# Patient Record
Sex: Female | Born: 1945 | Race: White | Hispanic: No | Smoking: Former smoker
Health system: Southern US, Community
[De-identification: ages and names within clinical notes are randomized; demographics above are authoritative.]

## PROBLEM LIST (undated history)

## (undated) DIAGNOSIS — F419 Anxiety disorder, unspecified: Secondary | ICD-10-CM

## (undated) DIAGNOSIS — E785 Hyperlipidemia, unspecified: Secondary | ICD-10-CM

## (undated) DIAGNOSIS — T4145XA Adverse effect of unspecified anesthetic, initial encounter: Secondary | ICD-10-CM

## (undated) DIAGNOSIS — R413 Other amnesia: Secondary | ICD-10-CM

## (undated) DIAGNOSIS — I255 Ischemic cardiomyopathy: Secondary | ICD-10-CM

## (undated) DIAGNOSIS — F329 Major depressive disorder, single episode, unspecified: Secondary | ICD-10-CM

## (undated) DIAGNOSIS — F410 Panic disorder [episodic paroxysmal anxiety] without agoraphobia: Secondary | ICD-10-CM

## (undated) DIAGNOSIS — I10 Essential (primary) hypertension: Secondary | ICD-10-CM

## (undated) DIAGNOSIS — I219 Acute myocardial infarction, unspecified: Secondary | ICD-10-CM

## (undated) DIAGNOSIS — I2510 Atherosclerotic heart disease of native coronary artery without angina pectoris: Secondary | ICD-10-CM

## (undated) DIAGNOSIS — I519 Heart disease, unspecified: Secondary | ICD-10-CM

## (undated) HISTORY — DX: Hyperlipidemia, unspecified: E78.5

## (undated) HISTORY — PX: CORONARY ANGIOPLASTY WITH STENT PLACEMENT: SHX49

## (undated) HISTORY — DX: Heart disease, unspecified: I51.9

## (undated) HISTORY — DX: Essential (primary) hypertension: I10

## (undated) HISTORY — PX: CATARACT EXTRACTION W/ INTRAOCULAR LENS  IMPLANT, BILATERAL: SHX1307

## (undated) HISTORY — DX: Ischemic cardiomyopathy: I25.5

## (undated) HISTORY — DX: Panic disorder (episodic paroxysmal anxiety): F41.0

## (undated) HISTORY — DX: Atherosclerotic heart disease of native coronary artery without angina pectoris: I25.10

## (undated) HISTORY — DX: Other amnesia: R41.3

## (undated) HISTORY — DX: Anxiety disorder, unspecified: F41.9

## (undated) HISTORY — DX: Major depressive disorder, single episode, unspecified: F32.9

---

## 1978-03-17 DIAGNOSIS — T8859XA Other complications of anesthesia, initial encounter: Secondary | ICD-10-CM

## 1978-03-17 HISTORY — DX: Other complications of anesthesia, initial encounter: T88.59XA

## 1978-03-17 HISTORY — PX: TUBAL LIGATION: SHX77

## 1990-03-17 HISTORY — PX: AUGMENTATION MAMMAPLASTY: SUR837

## 2001-10-11 ENCOUNTER — Ambulatory Visit (HOSPITAL_COMMUNITY): Admission: RE | Admit: 2001-10-11 | Discharge: 2001-10-11 | Payer: Self-pay | Admitting: Internal Medicine

## 2005-03-17 DIAGNOSIS — I219 Acute myocardial infarction, unspecified: Secondary | ICD-10-CM

## 2005-03-17 HISTORY — DX: Acute myocardial infarction, unspecified: I21.9

## 2005-05-21 ENCOUNTER — Encounter: Payer: Self-pay | Admitting: Cardiology

## 2005-09-22 ENCOUNTER — Ambulatory Visit: Payer: Self-pay | Admitting: Cardiology

## 2005-09-22 ENCOUNTER — Encounter: Payer: Self-pay | Admitting: Cardiology

## 2005-09-22 ENCOUNTER — Inpatient Hospital Stay (HOSPITAL_COMMUNITY): Admission: AD | Admit: 2005-09-22 | Discharge: 2005-09-27 | Payer: Self-pay | Admitting: Cardiology

## 2005-09-23 ENCOUNTER — Encounter: Payer: Self-pay | Admitting: Cardiology

## 2005-09-24 ENCOUNTER — Encounter: Payer: Self-pay | Admitting: Cardiology

## 2005-09-24 ENCOUNTER — Encounter: Payer: Self-pay | Admitting: Cardiovascular Disease

## 2005-10-10 ENCOUNTER — Ambulatory Visit: Payer: Self-pay | Admitting: Cardiology

## 2005-10-27 ENCOUNTER — Ambulatory Visit: Payer: Self-pay | Admitting: Cardiology

## 2005-11-28 ENCOUNTER — Ambulatory Visit: Payer: Self-pay | Admitting: Cardiology

## 2005-12-05 ENCOUNTER — Ambulatory Visit: Payer: Self-pay | Admitting: Cardiology

## 2005-12-05 ENCOUNTER — Encounter: Payer: Self-pay | Admitting: Cardiology

## 2006-07-13 ENCOUNTER — Encounter: Payer: Self-pay | Admitting: Cardiology

## 2006-07-15 ENCOUNTER — Ambulatory Visit: Payer: Self-pay | Admitting: Physician Assistant

## 2006-07-15 ENCOUNTER — Encounter: Payer: Self-pay | Admitting: Cardiology

## 2006-08-20 ENCOUNTER — Ambulatory Visit: Payer: Self-pay | Admitting: Cardiology

## 2006-08-24 ENCOUNTER — Ambulatory Visit: Payer: Self-pay | Admitting: Physician Assistant

## 2006-10-22 ENCOUNTER — Encounter: Payer: Self-pay | Admitting: Cardiology

## 2006-11-25 ENCOUNTER — Ambulatory Visit: Payer: Self-pay | Admitting: Cardiology

## 2007-05-11 IMAGING — CR DG CHEST 1V PORT
1 series · 1 of 1 positions shown · non-contrast
Comparison: none

CLINICAL DATA: Acute MI, stent placement.
 PORTABLE CHEST - 1 VIEW, 09/24/05 AT 8779 HOURS:

[view not recorded]
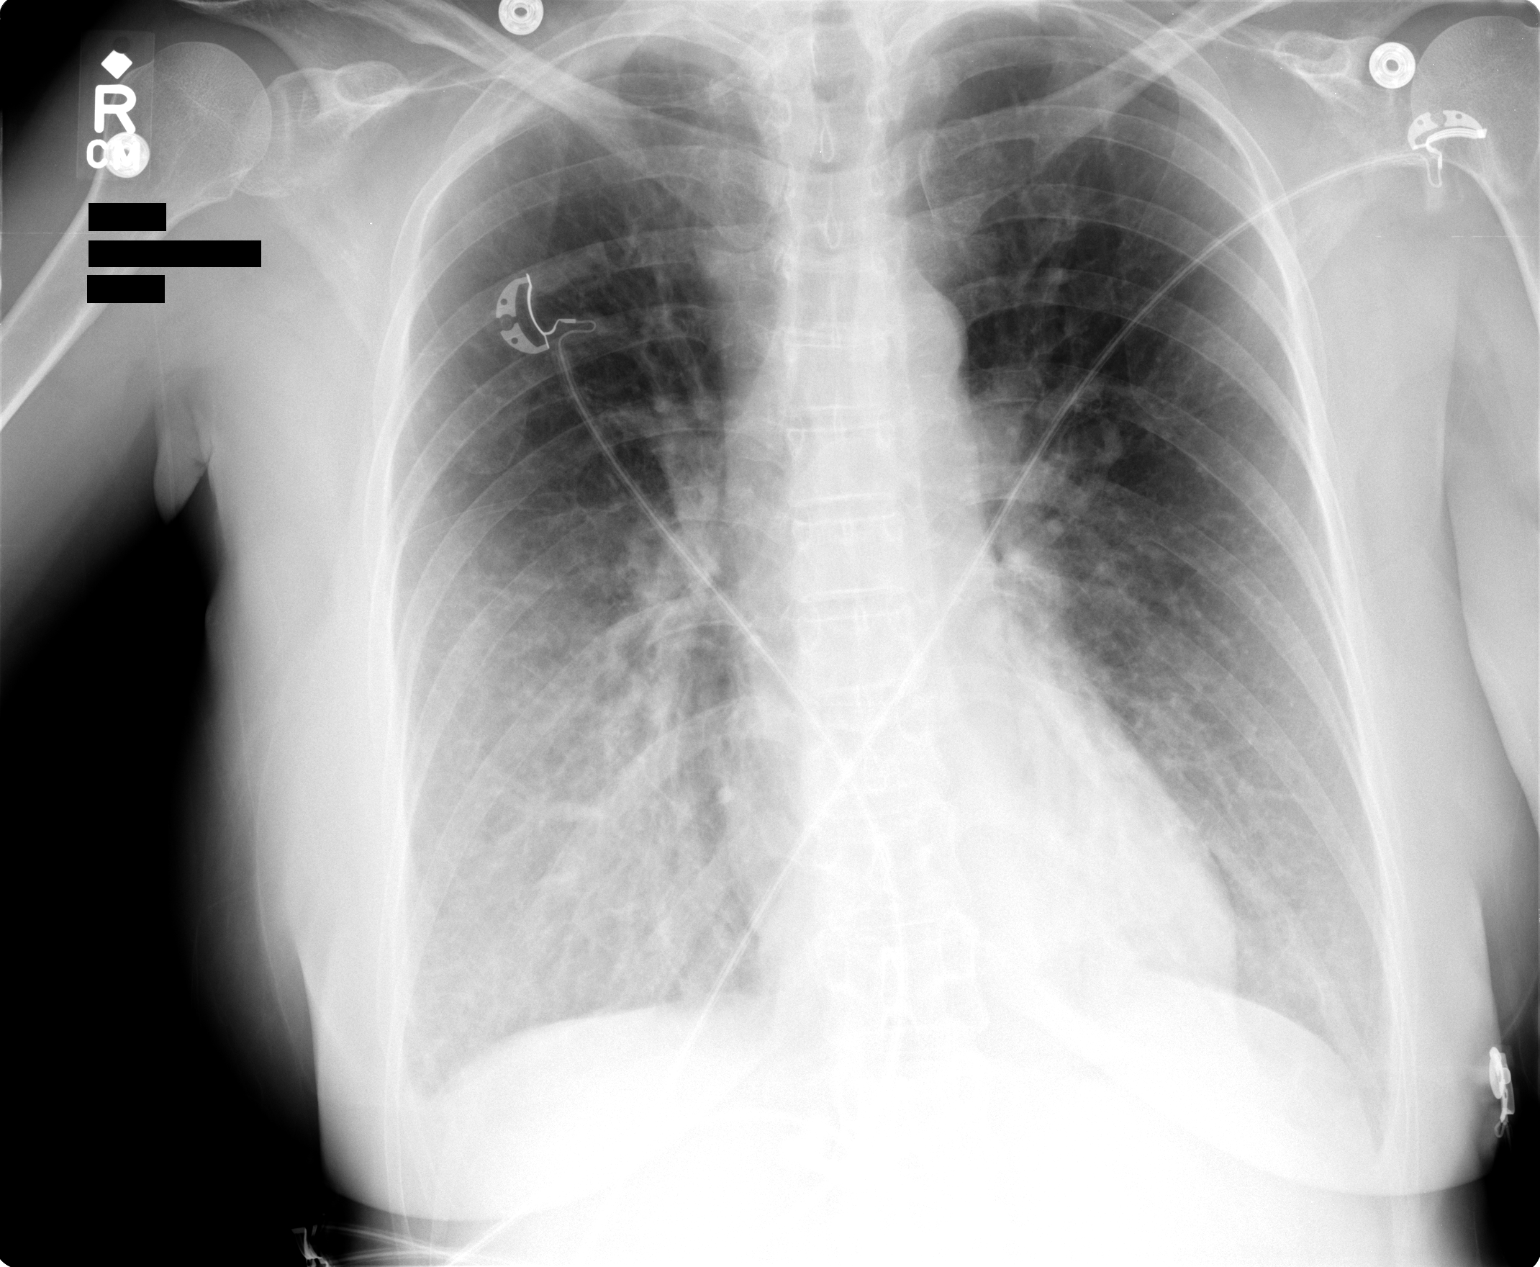

[1 of 1 positions shown; findings below may reference images not displayed]

FINDINGS: Minimal pulmonary vascular congestion is noted.  The lungs are slightly hyperaerated.  There may be very small effusions present.  Heart size is within normal limits.
IMPRESSION: Mild congestion and small effusions.

## 2007-12-16 ENCOUNTER — Ambulatory Visit: Payer: Self-pay | Admitting: Cardiology

## 2008-12-23 DIAGNOSIS — I2510 Atherosclerotic heart disease of native coronary artery without angina pectoris: Secondary | ICD-10-CM | POA: Insufficient documentation

## 2008-12-23 DIAGNOSIS — E785 Hyperlipidemia, unspecified: Secondary | ICD-10-CM | POA: Insufficient documentation

## 2008-12-23 DIAGNOSIS — I2589 Other forms of chronic ischemic heart disease: Secondary | ICD-10-CM

## 2009-02-05 ENCOUNTER — Ambulatory Visit: Payer: Self-pay | Admitting: Cardiology

## 2009-02-05 DIAGNOSIS — F1010 Alcohol abuse, uncomplicated: Secondary | ICD-10-CM | POA: Insufficient documentation

## 2009-02-15 ENCOUNTER — Telehealth (INDEPENDENT_AMBULATORY_CARE_PROVIDER_SITE_OTHER): Payer: Self-pay | Admitting: *Deleted

## 2009-11-06 ENCOUNTER — Telehealth (INDEPENDENT_AMBULATORY_CARE_PROVIDER_SITE_OTHER): Payer: Self-pay | Admitting: *Deleted

## 2010-02-11 ENCOUNTER — Encounter: Payer: Self-pay | Admitting: Cardiology

## 2010-02-19 ENCOUNTER — Encounter: Payer: Self-pay | Admitting: Cardiology

## 2010-03-20 ENCOUNTER — Encounter: Payer: Self-pay | Admitting: Cardiology

## 2010-04-03 ENCOUNTER — Ambulatory Visit
Admission: RE | Admit: 2010-04-03 | Discharge: 2010-04-03 | Payer: Self-pay | Source: Home / Self Care | Attending: Cardiology | Admitting: Cardiology

## 2010-04-03 DIAGNOSIS — F341 Dysthymic disorder: Secondary | ICD-10-CM | POA: Insufficient documentation

## 2010-04-16 NOTE — Progress Notes (Signed)
Summary: surgical clearance  Phone Note Call from Patient Call back at Home Phone [phone] Call back at [phone] ext [phone] work    Caller: Patient needing surgical Reason for Call: Talk to Nurse, Talk to Doctor Summary of Call: leave message onher  office phone if she is not there she needs to have surgical clearance not sure if she needs to be seen or not since last visit was nov 2010.  The procedure that she is having is to replace her breast implants(one had deflated)  Dr. Benna Dunks will be doing the surgery once ok is given. Initial call taken by: Claudette Laws,  November 06, 2009 10:54 AM     Appended Document: surgical clearance Patient can be cleared. Continue ASA if possible.   Appended Document: surgical clearance Pt notified and verbalized understanding.

## 2010-04-16 NOTE — Miscellaneous (Signed)
Summary: EXERCISE CARDIOLITE  Clinical Lists Changes  Orders: Added new Referral order of Nuclear Med (Nuc Med) - Signed

## 2010-04-18 NOTE — Assessment & Plan Note (Signed)
Summary: 1 YR FU -RECV REMINDER VS   Visit Type:  Follow-up Primary Provider:  Sherril Croon   History of Present Illness: the patient is a 65 year old female with a prior history of anterior wall myocardial infarction status post stent placement to the LAD in 2007. The patient underwent recently an exercise Cardiolite stress study. She had good exercise tolerance with no chest pain. Her ejection fraction was 63% there was a mid to distal anterior defect which was nonreversible consistent with prior infarct or scar.  The patient unfortunately is noncompliant with her statin drug therapy which he really does not like to take. She is willing to take a red yeast rice however. She denies any exertional chest pain or shortness of breath orthopnea PND she has no palpitations or syncope. She is doing well from a cardiovascular perspective. She remains physically active and is in cardiac rehabilitation.  She underwent successful repair of a breast implant that was leaking. She had no cardiovascular complications.  Preventive Screening-Counseling & Management  Alcohol-Tobacco     Smoking Status: quit     Year Quit: 2007  Current Medications (verified): 1)  Lisinopril 5 Mg Tabs (Lisinopril) .... Take 1/2 Tablet By Mouth Once A Day 2)  Coreg 3.125 Mg Tabs (Carvedilol) .... Take 1/2 Tablet By Mouth Once A Day 3)  Venlafaxine Hcl 75 Mg Tabs (Venlafaxine Hcl) .... Take 1 Tablet By Mouth Once A Day 4)  Flax Seed Oil 1000 Mg Caps (Flaxseed (Linseed)) .... Take 1 Tablet By Mouth Once A Day 5)  Fish Oil 1000 Mg Caps (Omega-3 Fatty Acids) .... Take 1 Tablet By Mouth Once A Day 6)  Aspirin 325 Mg Tabs (Aspirin) .... Take 1 Tablet By Mouth Once A Day 7)  Red Yeast Rice 600 Mg Tabs (Red Yeast Rice Extract) .... Take 2 Tabs (1200mg ) Two Times A Day  Allergies (verified): No Known Drug Allergies  Comments:  Nurse/Medical Assistant: The patient's medication list and allergies were reviewed with the patient and were  updated in the Medication and Allergy Lists.  Past History:  Past Medical History: Last updated: 12/23/2008 HYPERLIPIDEMIA-MIXED (ICD-272.4) CARDIOMYOPATHY, ISCHEMIC (ICD-414.8) CAD, NATIVE VESSEL (ICD-414.01) Major depressive disorder  Past Surgical History: Last updated: 12/23/2008 breast implants bilateral tubal  ligation.  Family History: Last updated: 12/23/2008 Family History of Coronary Artery Disease:  Family History of Hyperlipidemia:  Family History of Hypertension:  Family History of Thyroid Disease:   Social History: Last updated: 12/23/2008 Full Time Single  Tobacco Use - Yes.  Alcohol Use - no Regular Exercise - no Drug Use - no  Risk Factors: Exercise: no (12/23/2008)  Risk Factors: Smoking Status: quit (04/03/2010)  Review of Systems  The patient denies fatigue, malaise, fever, weight gain/loss, vision loss, decreased hearing, hoarseness, chest pain, palpitations, shortness of breath, prolonged cough, wheezing, sleep apnea, coughing up blood, abdominal pain, blood in stool, nausea, vomiting, diarrhea, heartburn, incontinence, blood in urine, muscle weakness, joint pain, leg swelling, rash, skin lesions, headache, fainting, dizziness, depression, anxiety, enlarged lymph nodes, easy bruising or bleeding, and environmental allergies.    Vital Signs:  Patient profile:   65 year old female Height:      67 inches Weight:      135 pounds BMI:     21.22 Pulse rate:   90 / minute BP sitting:   124 / 75  (left arm) Cuff size:   regular  Vitals Entered By: Carlye Grippe (April 03, 2010 1:20 PM)  Physical Exam  Additional Exam:  General: Well-developed, well-nourished in no distress head: Normocephalic and atraumatic eyes PERRLA/EOMI intact, conjunctiva and lids normal nose: No deformity or lesions mouth normal dentition, normal posterior pharynx neck: Supple, no JVD.  No masses, thyromegaly or abnormal cervical nodes lungs: Normal breath sounds  bilaterally without wheezing.  Normal percussion heart: regular rate and rhythm with normal S1 and S2, no S3 or S4.  PMI is normal.  No pathological murmurs abdomen: Normal bowel sounds, abdomen is soft and nontender without masses, organomegaly or hernias noted.  No hepatosplenomegaly musculoskeletal: Back normal, normal gait muscle strength and tone normal pulsus: Pulse is normal in all 4 extremities Extremities: No peripheral pitting edema neurologic: Alert and oriented x 3 skin: Intact without lesions or rashes cervical nodes: No significant adenopathy psychologic: Normal affect    Impression & Recommendations:  Problem # 1:  CARDIOMYOPATHY, ISCHEMIC (ICD-414.8) the patient has normal LV function. She did have a small scar in the distal anterior wall. She however has no recurrent chest pain. She can continue on medical therapy with risk factor modification. Due to the fact that the patient does not want to take a statin I have recommended 1200 mg p.o. b.i.d. Her updated medication list for this problem includes:    Lisinopril 5 Mg Tabs (Lisinopril) .Marland Kitchen... Take 1/2 tablet by mouth once a day    Coreg 3.125 Mg Tabs (Carvedilol) .Marland Kitchen... Take 1/2 tablet by mouth once a day    Aspirin 325 Mg Tabs (Aspirin) .Marland Kitchen... Take 1 tablet by mouth once a day  Problem # 2:  HYPERLIPIDEMIA-MIXED (ICD-272.4) as outlined above The following medications were removed from the medication list:    Pravachol 40 Mg Tabs (Pravastatin sodium) .Marland Kitchen... Take 1 tab by mouth at bedtime  Problem # 3:  ANXIETY DEPRESSION (ICD-300.4) much improved.  Patient Instructions: 1)  Red yeast rice 1200mg  two times a day  2)  Follow up in  1 year

## 2010-04-18 NOTE — Cardiovascular Report (Signed)
Summary: Cardiac Catheterization  Cardiac Catheterization   Imported By: Dorise Hiss 04/03/2010 11:06:36  _____________________________________________________________________  External Attachment:    Type:   Image     Comment:   External Document

## 2010-07-30 NOTE — Assessment & Plan Note (Signed)
[facility]                          [facility]   NAME:Robin [name], Robin [name]                         MRN:          [id number]  DATE:12/16/2007                            DOB:          Apr 25, 1945    HISTORY OF PRESENT ILLNESS:  The patient is a 65 year old female with a  history of coronary artery disease.  She is status post acute anterior  wall myocardial infarction in June of 2007.  She was stented with a bare-  metal stent to the LAD.  She has mild nonobstructive coronary artery  disease.  She has been doing extremely well.  She has no chest pain,  orthopnea, or PND.  She has no palpitations or syncope.  She states that  she has an extremely good year.   MEDICATIONS INCLUDE:  1. Plavix 75 mg a day.  2. Effexor 75 mg daily.  3. Simvastatin 80 mg daily.  4. Aspirin 81 mg.  5. Carvedilol.  6. Lisinopril.   The patient does report cramping and pain in the upper extremities.  She  is worried about Lyme disease and has been tested, but this was  negative.   PHYSICAL EXAMINATION:  VITAL SIGNS:  Blood pressure 124/76, heart rate  77, weight is 156 pounds.  NECK:  Normal carotid upstroke.  No carotid bruits.  LUNGS:  Clear breath sounds bilaterally.  HEART:  Regular rate and rhythm.  Normal S1 and S2.  No murmurs, rubs,  or gallops.  ABDOMEN:  Soft and nontender.  No rebound or guarding.  Good bowel  sounds.  EXTREMITIES:  No cyanosis.  No clubbing or edema.  NEURO:  The patient is alert and oriented.  Grossly nonfocal.   PROBLEM LIST:  1. Coronary artery disease.      a.     Status post acute anterior ST-elevation myocardial       infarction status post bare-metal stent in 2007.      b.     Moderate residual nonobstructive coronary artery disease.      c.     Cardiolite in September 2007 with normal viability of the       anterior wall, no __________, no ischemia.  2. Ischemic cardiomyopathy.      a.     Ejection fraction 45%.  b.     New York Heart Association class 1.  3. Treated dyslipidemia.  4. Rule out side effects secondary to statin therapy with mild      effects, previous history of alcohol use.  5. Major depressive disorder.   PLAN:  1. The patient is doing quite well from a cardiovascular standpoint.      We do not think of making any changes to her medications, but we do      need to give her refills.  2. I reviewed her EKG and this looks rather benign with only small      septal Q waves.  3. The patient can follow up with me in 1 year.     Learta Codding, MD,FACC  Electronically Signed  GED/MedQ  DD: 12/16/2007  DT: 12/17/2007  Job #: [id number]

## 2010-07-30 NOTE — Assessment & Plan Note (Signed)
[facility]                          [facility] NOTE   Robin [name], Robin [name]                         MRN:          [id number]  DATE:08/24/2006                            DOB:          1946-03-07    CARDIOLOGIST:  Robin Codding, MD,FACC   PRIMARY CARE PHYSICIAN:  Robin [name], M.D.   HISTORY OF PRESENT ILLNESS:  Robin [name] is a 65 year old female patient  with a history of coronary artery disease status post acute anterior ST  elevation myocardial infarction in July of 2007, treated with bare metal  stent to the LAD.  Please see my note from July 15, 2006 for complete  details.  She had a follow up Cardiolite study after her myocardial  infarction.  This revealed a large defect involving the anterior wall  and apex associated with akinetic and dyskinetic wall motion, most  consistent with a scar.  There was not any large viability, nor was then  any large degree of ischemia.  Her EF was 45%.  After I saw her, we set  her up for a follow up echocardiogram.  This showed mild systolic  dysfunction with an EF of 45%, distal posterior, periapical, distal  septal akinesis consistent with prior infarction and/or scar.  There  were no significant valvular abnormalities.  She returns to the office  today for a follow up.  When I last saw her, we tried to switch her over  to Coreg, but she had just gotten metoprolol filled, and she has not  switched over to that just yet.  She notes that she is doing well.  She  denies any chest discomfort reminiscent for previous angina.  She denies  any significant shortness of breath.  Denies any syncope or near  syncope.  She does note occasional chest burning that is clearly  associated with meals.  This happens quite rarely.  The last time she  noticed it was about 2-3 weeks ago.  She has been working on her diet  and has not yet gotten back into exercise.   CURRENT MEDICATIONS:  1. Plavix 75 mg daily.  2.  Metoprolol ER 25 mg a day.  3. Effexor 75 mg daily.  4. Simvastatin 80 mg q.h.s.  5. Aspirin 80 mg a day.  6. Nitroglycerin p.r.n. chest pain.  7. Xanax p.r.n. chest pain.   ALLERGIES:  No known drug allergies.   PHYSICAL EXAMINATION:  GENERAL:  She is a well-nourished, well-developed  female in no acute distress.  VITAL SIGNS:  Blood pressure 124/79, pulse 82, weight 155 pounds.  HEENT:  Normal.  NECK:  Without JVD.  HEART:  S1 and S2.  Regular rate and rhythm without murmurs.  LUNGS:  Clear to auscultation bilaterally without wheezes, rhonchi or  rales.  ABDOMEN:  Nontender with normoactive bowel sounds.  No organomegaly.  EXTREMITIES:  Without edema.  Calves soft and nontender.  SKIN:  Warm and dry.  NEUROLOGIC:  She is alert and oriented x3.  Cranial nerves II-XII are  intact.   IMPRESSION:  1. Coronary artery disease.  a.     Status post acute anterior ST elevated myocardial infarction       treated with a bare metal stent to the left anterior descending in       July of 2007.      b.     Moderate residual non-obstructive coronary artery disease as       outlined in previous notes.      c.     Cardiolite in September of 2007 revealing non-viability in       the anterior wall and apex with no large degree of ischemia.  2. Ischemic cardiomyopathy with an ejection fraction of 45%.      a.     New York Heart Association Class II symptoms.  3. Treated dyslipidemia.  4. Exsmoker.  5. Previous history of alcohol abuse.  6. Major depressive disorder, treated.   PLAN:  The patient returns to the office today for follow up.  Overall,  she is doing well.  We did recently recheck her lipid panel, and this  was improved.  Her triglycerides were 167, cholesterol 179, HDL 43, LDL  103.  She notes that she has learned that Effexor can have an effect on  your cholesterol.  I did look this up, and sure enough it can cause  hypercholesterolemia.  I have asked her that she go back to  see Dr. Sherril [name]  to see if there are any other alternative antidepressants that she could  try in lieu of Effexor.  Her LDL numbers have come down considerably  since we last saw each other.  I think we will give her another 3 months  on high-dose simvastatin.  We will recheck her lipids and LFT's in about  3 months.  If her LDL is still above 70, then we will change her to  either Crestor or Vytorin at that time.   She will begin the Coreg 3.125 mg twice a day soon after she runs out of  the metoprolol.   I will also start her on an ACE inhibitor with lisinopril 2.5 mg a day.  We will have her follow up in 1 weeks' time for a BMET.   I will bring her back in 3 months' time to see Dr. Andee [name] in follow up.   Addendum:  I will actually have her repeat lipids in 1 month and make  appropriate changes at that time, if needed.      Robin Newcomer, PA-C  Electronically Signed      Robin Abed, MD, [facility]  Electronically Signed   SW/MedQ  DD: 08/24/2006  DT: 08/24/2006  Job #: [id number]   cc:   Robin [name]

## 2010-07-30 NOTE — Assessment & Plan Note (Signed)
[facility]                          [facility]   NAME:Robin [name], Robin [name]                         MRN:          [id number]  DATE:11/25/2006                            DOB:          1945-08-20    HISTORY OF PRESENT ILLNESS:  Patient is a 65 year old female with a  history of coronary artery disease, status post acute anterior wall  myocardial infarction June of 2007. The patient was treated with a bare  metal stent to the LAD. She has done quite well. She has no recurrent  sub sternal chest pain or shortness of breath, orthopnea, or PND. She  remains very active. She is complied with her medical regimen although  she does report to me that she did not take her Coreg in the morning.  She feels very fatigued after taking a dose of Coreg and cut it back to  once a day. She denies any palpitations or syncope. She has stopped  smoking.   MEDICATIONS:  1. Plavix 75 mg p.o. daily.  2. Effexor 75 mg p.o. daily.  3. Simvastatin 80 mg p.o. at bedtime.  4. Aspirin 81 mg a day.  5. Carvedilol 3.125 mg p.o. b.i.d. but the patient is only taking it      once a day.  6. Lisinopril 2.5 mg p.o. daily.   PHYSICAL EXAMINATION:  VITAL SIGNS: Blood pressure is 117/73, heart rate  is 77, weight is 153 pounds.  NECK EXAM: Normal carotid upstroke. No carotid bruits.  HEENT: Normal.  LUNGS: Clear breath sounds bilaterally.  HEART: Regular rate and rhythm. Normal S1, S2. No murmurs, rubs, or  gallops.  ABDOMEN: Soft, nontender. No rebound or guarding. Good bowel sounds.  EXTREMITY EXAM: No cyanosis, clubbing, or edema.  NEURO: Patient is alert and oriented and grossly nonfocal.   PROBLEM LIST:  1. Coronary artery disease.      a.     Status post acute anterior ST elevation myocardial       infarction treated with a bare metal stent July 2007.      b.     Moderate residual nonobstructive coronary disease.      c.     Cardiolite September 2007 with  nonviability of anterior wall       and apex but no ischemia.  2. Ischemic cardiomyopathy.      a.     Ejection fraction of 45%.      b.     NYHA class of 1 to 2.  3. Treated dyslipidemia LDL now 81.  4. Ex-smoker.  5. Previous history of alcohol use.  6. Major depressive disorder, treated.   PLAN:  1. The patient is doing quite well. She reports no recurrent sub      sternal chest pain. We can continue her current medical regimen.  2. I did increase patient's lisinopril to 5 mg p.o. daily given her      left ventricular dysfunction.  3. We also reviewed the patient's lipid panel and her triglycerides      were elevated, and her LDL was at  goal. We increased her fish oil      to 1 gram p.o. b.i.d.  4. The patient can follow up with [place] in 1 year.     Learta Codding, MD,FACC  Electronically Signed    GED/MedQ  DD: 11/25/2006  DT: 11/26/2006  Job #: [id number]   cc:   Doreen Beam

## 2010-08-02 NOTE — H&P (Signed)
NAME:  Robin [name], Robin [name] NO.:  [id number]   MEDICAL RECORD NO.:  [id number]          PATIENT TYPE:  OIB   LOCATION:  2807                         FACILITY:  [facility]   PHYSICIAN:  Robin [name], M.D. [facility] OF BIRTH:  [birth year]   DATE OF ADMISSION:  09/22/2005  DATE OF DISCHARGE:                                HISTORY & PHYSICAL   BRIEF HISTORY:  Robin [name] is a 65 year old white female who is transferred  via EMS from [facility] secondary to acute myocardial infarction.  The patient describes left shoulder blade aching sensation and some chills  since yesterday afternoon.  She initially attributed this to musculoskeletal  discomfort secondary to heavy yard work with her upper extremities.  However  this morning her discomfort was still there, it became worse as the day  progressed and she continued to feel bad.  Approximately at 1:30 she went to  a neighbor's house who called EMS.  Upon EMS arrival she developed anterior  chest discomfort and EKG was consistent with myocardial infarction.  On  arrival to [facility] she received IV heparin, sublingual  nitroglycerin, aspirin, and IV nitroglycerin prior to transportation.   PAST MEDICAL HISTORY:  Notable for allergy to yellow jackets.  No known drug  allergies.   MEDICATIONS:  Prior to admission included Effexor unknown dosage daily.   PAST MEDICAL HISTORY:  Notable for recent diagnosis of mild emphysema.  She  denies any problems and diabetes, hypertension, myocardial infarction, CVA,  bleeding dyscrasias, thyroid dysfunction, or hyperlipidemia.   PAST SURGICAL HISTORY:  Notable for breast implants and bilateral tubal  ligation.   SOCIAL HISTORY:  She resides in [place] alone.  She has two sons that are alive  and well, two grandchildren, no great-grandchildren.  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  She smokes approximately one pack per day for 25 years,  has not had any alcohol in nine years.   Denies any drugs, herbal  medications, or specific diet.  She does not exercise, per se, but she is  very active, particularly in the yard.   FAMILY HISTORY:  Her mother died at age of 57, question neurological  problems, history of hypertension.  Father died at the age of 65 with a  history of bypass surgery is 15, peripheral vascular disease/amputation,  hyperlipidemia.  One brother died suddenly at the age of 89.  She has two  sisters, age 87 who is alive and age 8 1 with a history of hypertension,  thyroid disorder, hyperlipidemia, and tobacco use.   REVIEW OF SYSTEMS:  Notable for glasses, recent bronchitis approximately one  month ago, recent fatigue, postmenopausal treatment for depression.   PHYSICAL EXAMINATION:  GENERAL:  Well-nourished, well-developed, pleasant  white female in no apparent distress.  HEENT:  Initial blood pressure is 161/70, pulse 88 and regular, respirations  20, 98% sat on 2 liters.  HEENT:  Essentially unremarkable.  NECK:  Supple without thyromegaly, adenopathy, JVD or carotid bruits.  CHEST:  Symmetrical excursion and decreased breath sounds, but no rales,  rhonchi or wheezing.  HEART:  The heart sounds are distant.  Regular rate  and rhythm.  Normal S1, S2.  I do not appreciate any murmurs, rubs, clicks  or gallops.  SKIN/INTEGUMENT:  Intact.  ABDOMEN:  Slightly rounded.  Bowel sounds present without organomegaly,  masses or tenderness.  EXTREMITIES:  No cyanosis, clubbing, or edema.  Peripheral pulses are  symmetrical and intact.  The right groin has catheterization sheaths in  place.  I did not appreciate any left groin bruits.  MUSCULOSKELETAL:  Unremarkable.  NEUROLOGIC:  Unremarkable.   Chest x-ray report from [facility] is pending.  EKG from [facility] showed some sinus rhythm with second degree AV block, ST-segment  elevation inferolaterally, normal intervals.  H&H is 14.2 and 42.1, normal  indices, platelets 295,000, WBCs  10.5, sodium 140, potassium 4.0, BUN 6,  creatinine 0.7, glucose 165.  Initial CK was 75 with MB and troponins are  pending from [facility].  PTT is 23 and PT 10.6.   IMPRESSION:  1.  Acute anterolateral myocardial infarction.  2 . Tobacco use/emphysema.  1.  Hypertension.   DISPOSITION:  Dr. Riley [name], after reviewing the procedure, risks, and  benefits, proceeded with cardiac catheterization with Robin [name].  She will  be admitted to the coronary care unit and further treatment will be pending  her cardiac catheterization.  We will continue aspirin and place her on a  Statin medication and follow-up on blood work.      Robin [name], P.A. [facility]      Robin [name], M.D. [facility]  Electronically Signed    EW/MEDQ  D:  09/22/2005  T:  09/22/2005  Job:  [id number]   cc:   Robin [name]  Fax: [phone]   Robin [name], M.D. [facility]  [address]  [address]  [place]  [place] [address]

## 2010-08-02 NOTE — Assessment & Plan Note (Signed)
[facility]                            [facility]   NAME:Robin [name], Robin [name]                         MRN:          [id number]  DATE:10/10/2005                            DOB:          1945-09-19    The patient had an acute anterior MI and was urgently treated with bare-  metal stent on September 22, 2005.  The procedure was done by Dr. Riley Kill.  The  LAD was occluded.  There was other mild coronary disease to be followed.  It  was suggested that a followup echocardiogram be done at 6 weeks.  The  ejection fraction in the catheterization lab was in the 40% range but early  echocardiogram in the hospital (?) was in the 50% range.  The patient had  hypotension that led to captopril being stopped while in the hospital.  It  was therefore recommended that an ACE inhibitor not be started at that point  and the patient was tolerating low-dose Lopressor.  She was counseled in the  hospital on smoking cessation and, in fact, it appears she has stopped.   The patient is here with her sister today.  She did have a CPK of 2900 with  an MB of 260 and a peak troponin of 98.  She did have some liver function  elevations that may have been due to her MI.  Repeat LFTs are necessary.  She is on Lipitor.  The patient is not having any significant chest  discomfort.  Her original ischemic symptom was pain across her back and  shoulders.  She has had no return of this.  She has no marked shortness of  breath.  She feels fatigued even on low-dose metoprolol.   PAST MEDICAL HISTORY:  Allergies:  No known drug allergies.   Medications:  Plavix 75, metoprolol 25 (to be reduced to 12.5), Effexor 75,  Lipitor 40, and aspirin 325.   Other medical problems:  See the complete list below.   REVIEW OF SYSTEMS:  She has had some fatigue.  She is increasing her  activities.  She has a slight burning sensation in her left chest which does  not appear to be ischemia.   Otherwise, her review of systems is negative.   PHYSICAL EXAMINATION:  VITAL SIGNS:  Blood pressure is 110/60 with a pulse  of 71.  GENERAL:  The patient is oriented to person, time and place and her affect  is normal.  LUNGS:  Clear.  Respiratory effort is not labored.  HEENT:  Reveals no xanthelasma.  She has normal extraocular motion.  There  are no carotid bruits.  There is no jugular venous distention.  CARDIAC:  Reveals an S1 with an S2.  There are no clicks or significant  murmurs.  ABDOMEN:  Soft.  There is no peripheral edema.  There are no musculoskeletal  deformities.   EKG reveals sinus rhythm.  The patient has decreased voltage.  She has  anterior Q waves across the precordium.   PROBLEMS:  1.  Acute anterior myocardial infarction treated with bare-metal stenting to  the left anterior descending coronary artery on September 22, 2005.  2.  Ejection fraction 40% in the catheterization laboratory, question 50% by      early followup echocardiogram.  It was recommended that another      echocardiogram be done at 6 weeks and this is being scheduled.  3.  Residual noncritical circumflex disease and first diagonal disease and      right coronary artery disease.  4.  Hypotension, resolved, although she has relative hypotension.  5.  Dyslipidemia.  6.  Elevated liver enzymes and these will be repeated.  7.  I have considered adding an ACE inhibitor today.  However, because of      her marked fatigue I have not started it today and this should be      reconsidered when she is seen back for her next followup visit.  8.  Smoking.  It appears that she has stopped.   I have given permission to the patient to resume driving.  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  She knows not to do  any heavy lifting.  I have recommended cardiac rehab and this is to be  looked into.  When she is seen back, strong consideration needs to be given  to trying her on an ACE  inhibitor.                                   Luis Abed, MD, [facility]   JDK/MedQ  DD:  10/10/2005  DT:  10/10/2005  Job #:  [id number]

## 2010-08-02 NOTE — Cardiovascular Report (Signed)
Robin [name], Robin [name] NO.:  [id number]   MEDICAL RECORD NO.:  [id number]          PATIENT TYPE:  OIB   LOCATION:  2908                         FACILITY:  [facility]   PHYSICIAN:  Arturo Morton. Riley Kill, M.D. [facility] OF BIRTH:  [birth year]   DATE OF PROCEDURE:  DATE OF DISCHARGE:                              CARDIAC CATHETERIZATION   INDICATIONS:  Ms. Grigoryan is a 65 year old who presents with an acute anterior  wall infarction.  She was seen in the emergency room at [facility],  and transferred for immediate reperfusion therapy.  She arrived in the  catheterization laboratory and consented to enrollment in the [facility]  trial.   PROCEDURES:  1.  Left heart catheterization.  2.  Selective coronary arteriography.  3.  Selective left ventriculography.  4.  PTCA and stenting of the left anterior descending artery.   DESCRIPTION OF PROCEDURE:  The patient was brought to the catheterization  laboratory, and prepped and draped in the usual fashion.  She had a full  bladder.  There was an initial attempt to pass a Foley catheter, but this  was somewhat difficult.  There was some mild time delay in getting the case  started related to this.  We elected to abandon this, and the procedure was  started.  Through an anterior puncture, the right femoral artery was easily  entered.  A 6-French sheath was placed.  ACT was checked.  Views of the  right coronary artery were then obtained.  Following this, a JL-3.5 guiding  catheter was used intubate the left coronary.  The patient was randomized in  the [facility] trial, and given bivalirudin according to protocol.  ACT was  checked and found to be appropriate for percutaneous intervention.  A  Prowater guidewire was then placed down the artery and initial dilatation  done with a 2 mm x 15 Maverick balloon.  There was successful reperfusion  with this.  We then redirected the wire into the left anterior descending  artery, and  inflations were done with both a 2.5 and a 2.75-mm balloon.  There was marked improvement in the appearance of the artery.  We then  debated whether or not to place a stent, as the patient has multivessel  disease and we were considering potentially other options.  The proximal  lesion that was dilated showed some deterioration, and we went ahead and  placed a 32 x 2.75 Liberte stent just distal to the takeoff of the first  diagonal and overlapping the second diagonal, and ending it just prior to an  area of disease in the midvessel.  There was dramatic improvement in the  appearance of the artery.  At beginning of procedure, there was a 90%  stenosis and the total occlusion and, following this, this was all reduced  to 0% with establishment of TIMI III flow.  Then, following this,  ventriculography was then performed in the RAO projection.  All catheters  were subsequently removed and the femoral sheath was sewn into place.  I  reviewed the patient's angiograms with the patient's sister in detail.  Because of her markedly elevated LVEDP, we just gave her one dose of  intravenous metoprolol during the course of the procedure, and she was  moderately hypotensive after initial reperfusion.  There were no major  complications, and she was taken to the holding area in satisfactory  clinical condition.   HEMODYNAMIC DATA:  Initial central aortic pressure 119/83 with a mean of  101.  The left ventricular pressure was 94/31 and there was no gradient on  pullback across aortic valve.   ANGIOGRAPHIC DATA:  1.  Ventriculography was done in the RAO projection at the completion of      procedure.  There was hypokinesis to akinesis of the anterolateral      apical and distal inferior segments with an ejection fraction that would      be estimated in the range of about 40%.  There was some ectopy making      calculation difficult.  There was about 2+ mitral regurgitation noted.  2.  Right coronary is  a large-caliber vessel.  There was about 30-40% area      of mid disease and some luminal irregularity distally.  There is mild      collateralization of the mid-LAD noted on the initial right injection.  3.  The left main is free of critical disease.  4.  The left anterior descending artery demonstrates a 90% stenosis after      the takeoff of the first diagonal.  Following this, the vessel was      totally occluded.  Following reperfusion therapy this whole area is      reduced to 0%, over a 32 mm area.  Just distal to the stent, there is a      40-50% area of focal narrowing in the LAD, after the third diagonal      branch.  We specifically did not stent this, largely because of the      concern about potentially crossing another diagonal and then the lesion      did not appear that tight.  The first diagonal has about 70% ostial      narrowing and 70% mid-narrowing.  The second diagonal was a moderate      sized vessel that had about 90% narrowing and had a fairly steep angle      takeoff from the stent itself, and did not close down with coverage with      the stent in this area.  5.  The circumflex provides a first marginal branch with about 70% ostial      and then 70% mid-narrowing.  The AV circumflex has about 40% plaquing      and about 60% in the large marginal branch.   CONCLUSIONS:  1.  Moderate reduction in left ventricular function with elevated left      ventricular end-diastolic pressure in the distribution of the mid and      distal left anterior descending artery.  2.  Successful reperfusion therapy using a non-drug-eluting stent platform.  3.  Multivessel coronary artery disease with small to moderate size branch      involvement.   DISPOSITION:  I plan to review the films with my colleagues.  At the present  time, we will likely recommend aggressive medical therapy.  The major LAD is open and the right is not critically diseased.  The AV circumflex is also  not  critically disease, as well as the circumflex does not appear to be  critical.  There is significant diagonal disease, as well as first marginal  branch disease, and a fairly small-caliber vessel.  However, based on this,  we will likely recommend medical therapy, but I will review with my  colleagues.      Arturo Morton. Riley Kill, M.D. [facility]  Electronically Signed     TDS/MEDQ  D:  09/22/2005  T:  09/23/2005  Job:  [id number]   cc:   Doreen Beam  Fax: [phone]   Jonelle Sidle, M.D. [facility]  [address]  [place]  [place] [address]   [facility] Laboratory

## 2010-08-02 NOTE — Assessment & Plan Note (Signed)
[facility]                            [facility] NOTE   NAME:[name], [name]                         MRN:          [id number]  DATE:12/02/2005                            DOB:          Mar 21, 1945    HISTORY OF PRESENT ILLNESS:  The patient is a 65 year old female status post  acute anterior wall myocardial infarction on September 22, 2005.  The patient  underwent bare metal stent placement.  The patient had additional moderate  coronary artery disease.  The patient's initial ejection fraction was 40% at  range but at time of discharge reportedly her ejection fraction was 50%.  A  followup echocardiogram however, done at [facility] on November 13, 2005  demonstrate an ejection fraction of 30 to 35%.  In retrospect the patient  appeared  to have a  large anterior wall myocardial infarction with probable  late reprofusion.  The patient now presents for a followup.  The patient  reports occasional sensation of burning in the chest.  She denies any chest  pain.  She feels weak and fatigued.  She does get short of breath on  moderate exertion.  She stopped smoking tobacco.  She has occasional  tinnitus.   MEDICATIONS:  1. Plavix 75 mg daily.  2. Effexor 75 mg daily.  3. Lipitor 80 mg, half tablet p.o. daily.  4. Enteric coated aspirin 1/2 a tablet p.o. daily.   Of  note that the patient was not able to be started on ACE inhibitor due to  a relatively low blood pressure.   PHYSICAL EXAMINATION:  VITAL SIGNS:  Blood pressure 100/62.  Heart rate 78  beats per minute.  Weight is 145 pounds.  NECK:  Normal carotid upstroke and no carotid bruits.  LUNGS:  Breath sounds bilaterally.  HEART:  Regular rate and rhythm with normal S1, S2.  ABDOMEN:  Soft.  EXTREMITIES:  No cyanosis, clubbing or edema.   PROBLEM:  1. Status post anterior wall myocardial infarction.      a.     Bare metal stented LAD.      b.     Late reperfusion.      c.     Left  ventricular dysfunction ejection fraction 30 to 35% one       month after percutaneous coronary intervention.  2. Weakness.      a.     NYHA class 2B.  3. History of tobacco use.  4. Relative hypotension.  5. Dyslipidemia.  6. History of elevated liver function tests.   PLAN:  1. Patient will be scheduled for adenosine Cardiolite study with low level      exercise next week.  The patient report had 70% circumflex lesion and      70% diagonal lesion.  I want to make sure that she does not have any      residual ischemiaas well as to assess the viability of her anterior      wall.  2. The patient has been enrolled in cardiac rehab as of this office visit.  3. The patient  cannot be started on ACE inhibitor due to her relatively      low blood pressure and we will continue to monitor this.  4. I started the patient on Aldactone given her primary myocardial      infarction with low ejection fraction.  5. We will follow the patient closely and she may need to be considered in      the near future for implantable cardioverter-defibrillator  and      possible cardiac resynchronization therapy.                                   Learta Codding, MD,FACC   GED/MedQ  DD:  12/02/2005  DT:  12/03/2005  Job #:  [id number]

## 2010-08-02 NOTE — Op Note (Signed)
NAME:  Robin [name], BAUMBACH                            ACCOUNT NO.:  [id number]   MEDICAL RECORD NO.:  [id number]                   PATIENT TYPE:  AMB   LOCATION:  DAY                                  FACILITY:  [facility]   PHYSICIAN:  Gerrit Friends. Rourk, M.D.               DATE OF BIRTH:  1945-12-03   DATE OF PROCEDURE:  10/11/2001  DATE OF DISCHARGE:  10/11/2001                                 OPERATIVE REPORT   PROCEDURE:  Diagnostic colonoscopy.   INDICATIONS FOR PROCEDURE:  The patient is a 66 year old lady with a recent  illness characterized by abdominal bloating, cramps, diarrhea, and fever.  Symptoms lasted for approximately one week earlier this month, and those  symptoms have resolved.  CT of the abdomen and pelvis at [facility]  on 09/28/01 demonstrated possibly a filling defect in the cecum and early  changes suggestive of sigmoid diverticulosis.  Colonoscopy is being done to  further evaluate the abnormalities seen on CT scan.  This approach has been  discussed with [name] at the bedside.  Potential risks, benefits, and  alternatives have been reviewed, questions answered, and she is agreeable.   DESCRIPTION OF PROCEDURE:  Oxygen saturation, blood pressure, pulse, and  respirations were monitored throughout the entire procedure.   Conscious sedation:  Versed 3 mg IV, Demerol 75 mg IV, in divided doses.   Instrument:  Olympus video chip colonoscope.   Findings:  Digital rectal examination revealed no abnormalities.   Colonoscopic findings:  The prep was good.  Rectum:  Examination of the  rectal mucosa, including retroflexed view of the anal verge, revealed only  internal hemorrhoids.  Colon:  The colonic mucosa was surveyed from the  rectosigmoid junction through the left, transverse, right colon to the area  of the appendiceal orifice and the ileocecal valve and cecum.  These  structures were well-seen and photographed for the record.  They appeared  normal.  The  colonic mucosa all the way to the cecum appeared normal.  The  terminal ileum was intubated to 10 cm, and this segment of the GI tract also  appeared normal.  From this level the scope was slowly withdrawn, and all  previously-viewed mucosal surfaces were again seen, and again no  abnormalities were observed.  The patient tolerated the procedure well and  was reactivated in endoscopy.   IMPRESSION:  1. Internal hemorrhoids, otherwise normal rectum.  2. Normal colon.  3. Normal terminal ileum.   I suspect the abnormality on CT scan was stool.  [name] most likely had a  self-limiting infectious process to account for her recent symptoms.  I am  glad to see that she is now asymptomatic.    RECOMMENDATIONS:  1. Repeat colonoscopy in 10 years.  2. Follow up with Dr. Sherril Croon as needed.  Gerrit Friends. Rourk, M.D.    RMR/MEDQ  D:  10/11/2001  T:  10/15/2001  Job:  [id number]   cc:   Doreen Beam

## 2010-08-02 NOTE — Assessment & Plan Note (Signed)
[facility]                          [facility] NOTE   NAME:Robin [name], Robin [name]                           MRN:          [id number]  DATE:07/15/2006                            DOB:          Nov 10, 1945    PRIMARY CARE PHYSICIAN:  Dr. Sherril Croon.   PRIMARY CARDIOLOGIST:  Dr. Lewayne Bunting.   HISTORY OF PRESENT ILLNESS:  Ms. Cowie is a 65 year old patient with a  history of coronary disease, status post acute anterior ST elevation  myocardial infarction in July 2007 treated with a bare metal stent to  the LAD who had residual stenosis of 70% ostial and 70% mid in the first  diagonal, 90% stenosis in the second diagonal, 70% ostial and 70% mid in  the first marginal branch of the circumflex, AV circumflex with 40%  plaque and about 60% large marginal branch, and 40% - 50% narrowing in  the LAD beyond the third diagonal.  The RCA had mild disease with 30% -  40% mid lesions and minimal irregularities distally.  At the time of the  patient's myocardial infarction her EF was about 40% - 50%.  A followup  echocardiogram revealed an EF of 30% - 35%.  She saw Dr. Andee Lineman in  September of 2007.  At that time he set her up for an adenosine Myoview  to assess her for viability and reassess her LV function.  That was  performed September of 2007.  It revealed a large, dense defect  involving the anterior wall and the apex associated with akinetic to  dyskinetic wall motion, most consistent with scar.  There did not appear  to be any large viability in this segment or any large degree of  ischemia, and the EF was calculated at 45%.  She returns today for  followup.  She tells me that after her stress test was performed she was  taken off of her aldactone by Dr. Andee Lineman.  She is doing well.  She  continues to be somewhat fatigued.  She did finish cardiac rehab.  She  denies any significant dyspnea on exertion.  She denies any chest  discomfort reminiscent of her previous  angina.  She does have occasional  burning in her left chest associated with belching.  This occurs about  every 2 or 3 weeks.  She denies any waterbrash symptoms, dysphagia,  odynophagia.  She denies any associated shortness of breath, nausea,  diaphoresis.  She denies any syncope or near syncope.  She does have  palpitations.  These have been ongoing for many years.  She says has  been diagnosed with PVCs.  She had these even before her myocardial  infarction and they have not changed any since then.  She denies  orthopnea, paroxysmal nocturnal dyspnea, lower extremity edema.   CURRENT MEDICATIONS:  1. Plavix 75 mg daily.  2. Metoprolol ER 25 mg daily.  3. Effexor 75 mg daily.  4. Simvastatin 40 mg nightly.  5. Nitroglycerin p.r.n.  6. Xanax p.r.n.   ALLERGIES:  NO KNOWN DRUG ALLERGIES.   PHYSICAL EXAMINATION:  She is a  well-nourished, well-developed female in  no distress.  Blood pressure 128/84, pulse 68, weight 156.6 pounds.  HEENT:  Unremarkable.  NECK:  No JVD.  CARDIO:  Normal S1, S2; regular rate and rhythm without murmurs.  LUNGS:  Clear to auscultation bilaterally without wheezing, rhonchi, or  rales.  ABDOMEN:  Soft, nontender with normoactive bowel sounds, no  organomegaly.  EXTREMITIES:  Without edema; calves soft, nontender.  SKIN:  Warm and dry.  NEUROLOGIC:  She is alert and oriented x3, cranial nerves II-XII grossly  intact.   Electrocardiogram reveals sinus rhythm with a heart rate of 61, normal  axis, T wave inversion in V1 through 5, no significant changes since  previous tracing dated October 10, 2005.   DATABASE:  On recent lipid profile revealing total cholesterol of 271,  HDL 45, LDL 186, triglycerides 200, LFTs were okay.   IMPRESSION:  1. Coronary artery disease.      a.     Status post acute anterior ST elevation myocardial       infarction treated with a bare metal stent to the LAD July of       2007.      b.     Residual coronary disease as  outlined above.  2. Ischemic cardiomyopathy with an ejection fraction of 45% by recent      nuclear study.      a.     Previous ejection fraction of 30% - 35% by echocardiogram       August 2007.  3. Treated dyslipidemia.  4. Ex-smoker.  5. Previous history of alcohol abuse.   PLAN:  The patient presents to the office today for routine followup.  Overall she is doing well from a cardiovascular standpoint.  She is not  having any anginal symptoms.  She does have occasional chest burning and  belching.  I have advised her to try an over the counter H2 antagonist  versus proton pump inhibitors.  If her symptoms should become more  frequent or worse, she should either contact us or her primary care  physician.   Her ejection fraction was improved on her last Myoview study.  It is now  45%.  She exhibits New York Heart Association Class II symptoms.  I  think at this point in time we will try to switch her from metoprolol to  Carvedilol 3.125 mg twice a day to better treat her cardiomyopathy.  She  has had hypotension in the past.  When we see her back in followup, if  her blood pressure will tolerate it, we should try to initiate a very  low dose ACE inhibitor at that point in time.   Her cholesterol is out of control.  She does not have insurance to help  pay for medications.  At this point in time we will try to double up on  her simvastatin to 80 mg nightly and recheck her lipids and LFTs in  about 4-6 weeks.  If we are not getting a good result with that we will  have to try to either switch her to Vytorin or Crestor.  Will try to see  if she would qualify for any assistance for Vytorin or Crestor.  The  patient will be brought back in followup in the next 6 weeks.   I also asked the patient to get back on a baby aspirin a day.  I am not  exactly sure why this stopped.  Apparently she had some frequent bruising.  If  this becomes a significant problem then she should switch  to a  baby aspirin every other day.      Tereso Newcomer, PA-C       Learta Codding, MD,FACC    SW/MedQ  DD: 07/15/2006  DT: 07/15/2006  Job #: [id number]   cc:   Doreen Beam

## 2010-08-02 NOTE — Discharge Summary (Signed)
Robin [name], Robin [name] NO.:  [id number]   MEDICAL RECORD NO.:  [id number]          PATIENT TYPE:  INP   LOCATION:  2039                         FACILITY:  [facility]   PHYSICIAN:  Learta Codding, M.D. LHCDATE OF BIRTH:  Feb 02, 1946   DATE OF ADMISSION:  09/22/2005  DATE OF DISCHARGE:  09/27/2005                                 DISCHARGE SUMMARY   PRIMARY CARDIOLOGIST:  Learta Codding, M.D. [facility].   PRINCIPAL DIAGNOSES:  1.  Status post acute anterior myocardial infarction.      1.  Emergent bare metal stenting left anterior descending artery, July          9.      2.  EF approximately 40% by catheterization (50% by follow-up by          echocardiogram).      3.  Residual noncritical circumflex coronary artery and first diagonal          disease; nonobstructive right coronary artery disease.  2.  Hypotension.  3.  Dyslipidemia  4.  Elevated liver enzymes.   SECONDARY DIAGNOSES:  1.  History of hypertension.  2.  History of tobacco.   HOSPITAL COURSE:  Robin [name] is a 65 year old female with no prior history of  heart disease, who initially presented to [facility] ER with  progressive left shoulder discomfort.  She was found to have abnormal EKG on  presentation __________ presentation was suggestive of acute anterior  myocardial infarction.  The patient was stabilized and subsequently  transferred emergently to the cardiac catheterization lab.   The patient underwent successful percutaneous intervention, by Dr. Bonnee Quin, with placement of a bare metal stent for treatment of a totally  occluded LAD.  There were no noted complications.  Residual disease was  noted (see cath report) with recommendation to continue treating medically.  Moderate __________ at cath (approximately 40%).  Follow-up echocardiogram  suggested EF of approximately 50%.   Post-op course complicated by relative hypotension requiring cessation of  Captopril.  Consequently, Dr. Andee Lineman  recommended that ACE inhibitor not be  added at time of discharge.  Of note, the patient was tolerating low dose of  Lopressor, however.  The patient was also counseled by smoking cessation  team. The patient was cleared for discharge on hospital day #5 with no  complaint of chest pain.  Blood pressure was low, however, 90/62.   DISCHARGE LABORATORY:  WBC 6.2, hemoglobin 12.8, hematocrit 37, platelets  290.  Sodium 140, potassium 4.0, BUN 13, creatinine 0.9, glucose 111.  Outstanding labs:  Cardiac enzymes:  Peak total CPK 2900 with a peak MB of  approximately 260 on admission; peak troponin I 98.6.  Lipid profile:  Total  cholesterol 220, triglyceride 133, HDL 38, and LDL 155.  TSH 4.5 elevated.  Back to the laboratory data, elevated AST 221, elevated ALT 43, with mildly  decreased albumin 3.3.  Electrolytes and renal function remained normal  throughout.  Hemoglobin A1c 5.9.   Chest X-Ray: (September 24, 2005):  Mild congestion with small effusions.   DISCHARGE MEDICATIONS:  1.  Plavix 75 mg every day (at least 1 year).  2.  Aspirin 325 mg every day.  3.  Lipitor 40 mg every day.  4.  Toprol XL 25 mg every day.  5.  Nitrostat 0.4 mEq as directed.   INSTRUCTIONS:  1.  Patient instructed to refrain from driving or heavy lifting for at least      2 weeks.  2.  The patient is advised strongly advised to stop smoking tobacco.   FOLLOWUP:  1.  Dr. Lewayne Bunting on October 10, 2005 at 2:00 p.m. at [facility] in      [place].  2.  The patient will need a follow-up 2-D echocardiogram in 6 weeks.   DISPOSITION:  Stable.   DISCHARGE DURATION:  Forty 40 minutes.      Robin [name], P.A. [facility]      Learta Codding, M.D. [facility]  Electronically Signed    GS/MEDQ  D:  09/27/2005  T:  09/27/2005  Job:  [id number]   cc:   [facility] [place]  [address]  [address]  [place], [place] [address]

## 2010-09-19 ENCOUNTER — Other Ambulatory Visit: Payer: Self-pay | Admitting: *Deleted

## 2010-09-19 MED ORDER — CARVEDILOL 3.125 MG PO TABS: ORAL_TABLET | ORAL | Status: DC

## 2010-09-23 ENCOUNTER — Encounter: Payer: Self-pay | Admitting: Cardiology

## 2011-05-08 ENCOUNTER — Encounter: Payer: Self-pay | Admitting: Cardiology

## 2011-05-08 ENCOUNTER — Ambulatory Visit (INDEPENDENT_AMBULATORY_CARE_PROVIDER_SITE_OTHER): Payer: Medicare Other | Admitting: Cardiology

## 2011-05-08 VITALS — BP 109/75 | HR 68 | Ht 67.0 in | Wt 138.0 lb

## 2011-05-08 DIAGNOSIS — I429 Cardiomyopathy, unspecified: Secondary | ICD-10-CM | POA: Insufficient documentation

## 2011-05-08 DIAGNOSIS — I2510 Atherosclerotic heart disease of native coronary artery without angina pectoris: Secondary | ICD-10-CM | POA: Insufficient documentation

## 2011-05-08 DIAGNOSIS — E785 Hyperlipidemia, unspecified: Secondary | ICD-10-CM | POA: Insufficient documentation

## 2011-05-08 DIAGNOSIS — I428 Other cardiomyopathies: Secondary | ICD-10-CM

## 2011-05-08 DIAGNOSIS — F329 Major depressive disorder, single episode, unspecified: Secondary | ICD-10-CM

## 2011-05-08 MED ORDER — NITROGLYCERIN 0.4 MG SL SUBL: 0.4000 mg | SUBLINGUAL_TABLET | SUBLINGUAL | Status: DC | PRN

## 2011-05-08 NOTE — Patient Instructions (Signed)
   Nitroglycerin as needed for severe chest pain only Your physician wants you to follow up in:  1 year.  You will receive a reminder letter in the mail one-two months in advance.  If you don't receive a letter, please call our office to schedule the follow up appointment

## 2011-05-08 NOTE — Progress Notes (Signed)
Robin Bottoms, MD, [facility] ABIM Board Certified in Adult Cardiovascular Medicine,Internal Medicine and Critical Care Medicine    CC: Followup patient history of coronary artery disease  HPI:  The patient is a 66 year old female with history of coronary artery status post stent to the LAD in 2007. The patient reports no recurrent chest pain. She has no shortness of breath on exertion or at rest. She reports no orthopnea PND. She is not limited in any of her functional activities. She actually exercises 5 times a week. She does this several hours a day. She has a normal sleep pattern. Her blood pressure has been well-controlled. She reports no palpitations presyncope or syncope. She still is occasional mild depressive episodes.  PMH: reviewed and listed in Problem List in Electronic Records (and see below) Past Medical History  Diagnosis Date  . HLD (hyperlipidemia)     mixed  . Cardiomyopathy     Stable with normal ejection fraction  . CAD (coronary artery disease)     Status post stent to the LAD in 2007 post anterior wall myocardial infarction normal exercise Cardiolite study 2011 no ischemia with mid to distal anterior defect fixed ejection fraction 63%.  . Major depressive disorder    Past Surgical History  Procedure Date  . Breast enhancement surgery   . Bilateral tubal ligation     Allergies/SH/FHX : available in Electronic Records for review  Allergies  Allergen Reactions  . Prednisolone Acetate Other (See Comments)    Panic attacks   History   Social History  . Marital Status: Divorced    Spouse Name: N/A    Number of Children: N/A  . Years of Education: N/A   Occupational History  . Not on file.   Social History Main Topics  . Smoking status: Former Smoker -- 1.0 packs/day for 30 years    Types: Cigarettes    Quit date: 10/15/2005  . Smokeless tobacco: Never Used  . Alcohol Use: No  . Drug Use: No  . Sexually Active: Not on file   Other Topics Concern  .  Not on file   Social History Narrative   Full time. Single. Does not get regular exercise.   Family History  Problem Relation Age of Onset  . Coronary artery disease      family hx  . Hyperlipidemia      family hx  . Hypertension      family hx  . Thyroid disease      family hx    Medications: Current Outpatient Prescriptions  Medication Sig Dispense Refill  . aspirin 325 MG tablet Take 325 mg by mouth daily.        . carvedilol (COREG) 3.125 MG tablet Take 1/2 tab daily  45 tablet  3  . lisinopril (PRINIVIL,ZESTRIL) 5 MG tablet Take 2.5 mg by mouth daily.        . Omega-3 Fatty Acids (FISH OIL) 1000 MG CAPS Take 1 capsule by mouth daily.        . Red Yeast Rice 600 MG TABS Take 2 tablets by mouth 2 (two) times daily.        Marland Kitchen venlafaxine (EFFEXOR) 75 MG tablet Take 75 mg by mouth daily.        . nitroGLYCERIN (NITROSTAT) 0.4 MG SL tablet Place 1 tablet (0.4 mg total) under the tongue every 5 (five) minutes as needed for chest pain.  25 tablet  3    ROS: No nausea or vomiting. No fever  or chills.No melena or hematochezia.No bleeding.No claudication  Physical Exam: BP 109/75  Pulse 68  Ht 5\' 7"  (1.702 m)  Wt 138 lb (62.596 kg)  BMI 21.61 kg/m2 General: Well-nourished white female in no distress Neck: Normal carotid upstroke no carotid bruits. No thyromegaly. No nodular thyroid JVP is 5-6 cm Lungs: Clear breath sounds bilaterally without wheezing. Cardiac: Regular rate and rhythm with normal S1-S2 Vascular: No edema. Normal distal pulses Skin: Warm and dry Physcologic: Normal affect  12lead ECG: Normal sinus rhythm septal infarct pattern old otherwise no acute ischemic changes. Limited bedside ECHO:N/A   Patient Active Problem List  Diagnoses  . ALCOHOL USE  . CARDIOMYOPATHY, ISCHEMIC  . Major depressive disorder  . CAD (coronary artery disease)  . HLD (hyperlipidemia)    PLAN  The patient is doing well from a cardiovascular perspective. She can continue on  her current medical regimen of low dose Coreg and lisinopril. Her blood pressures relatively low but she has no dizziness or weakness. We refilled her sublingual nitroglycerin today. No indication for stress testing at the present time. The patient will followup in one year.

## 2011-06-16 ENCOUNTER — Other Ambulatory Visit: Payer: Self-pay | Admitting: Cardiology

## 2011-08-31 DIAGNOSIS — R079 Chest pain, unspecified: Secondary | ICD-10-CM

## 2011-09-05 DIAGNOSIS — R072 Precordial pain: Secondary | ICD-10-CM

## 2011-09-19 ENCOUNTER — Ambulatory Visit (INDEPENDENT_AMBULATORY_CARE_PROVIDER_SITE_OTHER): Payer: Medicare Other | Admitting: Physician Assistant

## 2011-09-19 ENCOUNTER — Encounter: Payer: Self-pay | Admitting: Physician Assistant

## 2011-09-19 ENCOUNTER — Encounter: Payer: Self-pay | Admitting: *Deleted

## 2011-09-19 VITALS — BP 102/63 | HR 81 | Ht 67.0 in | Wt 131.0 lb

## 2011-09-19 DIAGNOSIS — Z01810 Encounter for preprocedural cardiovascular examination: Secondary | ICD-10-CM

## 2011-09-19 DIAGNOSIS — R9430 Abnormal result of cardiovascular function study, unspecified: Secondary | ICD-10-CM

## 2011-09-19 DIAGNOSIS — E785 Hyperlipidemia, unspecified: Secondary | ICD-10-CM

## 2011-09-19 DIAGNOSIS — I2510 Atherosclerotic heart disease of native coronary artery without angina pectoris: Secondary | ICD-10-CM

## 2011-09-19 NOTE — Patient Instructions (Addendum)
Your physician has requested that you have a cardiac catheterization on Tuesday July 9th 2013 @12 :30pm with Dr. Elease Hashimoto at the [facility] at [facility]. Cardiac catheterization is used to diagnose and/or treat various heart conditions. Doctors may recommend this procedure for a number of different reasons. The most common reason is to evaluate chest pain. Chest pain can be a symptom of coronary artery disease (CAD), and cardiac catheterization can show whether plaque is narrowing or blocking your heart's arteries. This procedure is also used to evaluate the valves, as well as measure the blood flow and oxygen levels in different parts of your heart. For further information please visit [email or web address] Please follow instruction sheet, as given. Your physician recommends that you return for a FASTING lipid profile:along with Pre-cath labs at [facility] on Monday morning Please do not eat or drink anything for 8 hours prior to getting this done. Your physician recommends that you continue on your current medications as directed. Please refer to the Current Medication list given to you today.

## 2011-09-19 NOTE — Assessment & Plan Note (Signed)
Will reassess lipid status with a FLP/LFT profile. Target LDL 70 or less, if feasible.

## 2011-09-19 NOTE — Assessment & Plan Note (Signed)
Recommendation is to proceed with an elective cardiac catheterization early next week, to rule out significant CAD progression. Patient is agreeable with this recommendation, and risks/benefits were discussed and she is willing to proceed. We'll continue current medication regimen, which includes low-dose aspirin and low-dose carvedilol. Patient does have prn NTG. This was also reviewed with, and approved by, Dr. Willa Rough.

## 2011-09-19 NOTE — Progress Notes (Signed)
Primary Cardiologist: Lewayne Bunting, MD   HPI: Patient presents as post hospital followup from [facility], following recent brief stay for evaluation of CP. Seen in consultation by cardiology fellow on Sunday, ruled out for MI with normal cardiac markers, and referred for outpatient stress testing. This was performed on June 21, and reviewed by Dr. Andee Lineman. It was an adequate GXT stress test (PMH R. 87%), with no associated CP or significant EKG changes. Perfusion imaging, however, yielded a medium, completely reversible inferior/inferoseptal defect, consistent with ischemia; there was a second, large fixed anterior defect with associated severe HK, consistent with prior MI.  These results were reviewed with the patient today. She has since had occasional brief spells of CP, as well as back pain, the latter reminiscent of her MI presentation in 2007, and which precipitated her recent hospitalization. Her symptoms are not strictly correlated with exertion, however. Again, she did not experience similar symptoms during recent GXT testing. She has not had to use any of her prn NTG.  Allergies  Allergen Reactions  . Prednisolone Acetate Other (See Comments)    Panic attacks    Current Outpatient Prescriptions  Medication Sig Dispense Refill  . aspirin 325 MG tablet Take 325 mg by mouth daily.        . carvedilol (COREG) 3.125 MG tablet Take 1/2 tab daily  45 tablet  3  . lisinopril (PRINIVIL,ZESTRIL) 5 MG tablet TAKE 1/2 TABLET BY MOUTH ONCE A DAY  45 tablet  3  . nitroGLYCERIN (NITROSTAT) 0.4 MG SL tablet Place 1 tablet (0.4 mg total) under the tongue every 5 (five) minutes as needed for chest pain.  25 tablet  3  . Omega-3 Fatty Acids (FISH OIL) 1000 MG CAPS Take 1 capsule by mouth daily.        . Red Yeast Rice 600 MG TABS Take 2 tablets by mouth 2 (two) times daily.        Marland Kitchen venlafaxine (EFFEXOR) 75 MG tablet Take 75 mg by mouth daily.          Past Medical History  Diagnosis Date  . HLD  (hyperlipidemia)     mixed  . Cardiomyopathy     Stable with normal ejection fraction  . CAD (coronary artery disease)     Status post stent to the LAD in 2007 post anterior wall myocardial infarction normal exercise Cardiolite study 2011 no ischemia with mid to distal anterior defect fixed ejection fraction 63%.  . Major depressive disorder     Past Surgical History  Procedure Date  . Breast enhancement surgery   . Bilateral tubal ligation     History   Social History  . Marital Status: Divorced    Spouse Name: N/A    Number of Children: N/A  . Years of Education: N/A   Occupational History  . Not on file.   Social History Main Topics  . Smoking status: Former Smoker -- 1.0 packs/day for 30 years    Types: Cigarettes    Quit date: 10/15/2005  . Smokeless tobacco: Never Used  . Alcohol Use: No  . Drug Use: No  . Sexually Active: Not on file   Other Topics Concern  . Not on file   Social History Narrative   Full time. Single. Does not get regular exercise.    Family History  Problem Relation Age of Onset  . Coronary artery disease      family hx  . Hyperlipidemia      family hx  .  Hypertension      family hx  . Thyroid disease      family hx    ROS: no nausea, vomiting; no fever, chills; no melena, hematochezia; no claudication  PHYSICAL EXAM: BP 102/63  Pulse 81  Ht 5\' 7"  (1.702 m)  Wt 131 lb (59.421 kg)  BMI 20.52 kg/m2 GENERAL: 66 year old female, sitting upright; NAD HEENT: NCAT, PERRLA, EOMI; sclera clear; no xanthelasma NECK: palpable bilateral carotid pulses, no bruits; no JVD; no TM LUNGS: CTA bilaterally CARDIAC: RRR (S1, S2); no significant murmurs; no rubs or gallops ABDOMEN: soft, non-tender; intact BS EXTREMETIES: intact femoral and distal pulses; no femoral bruits; no significant peripheral edema SKIN: warm/dry; no obvious rash/lesions MUSCULOSKELETAL: no joint deformity NEURO: no focal deficit; NL affect   EKG:    ASSESSMENT &  PLAN:  CAD (coronary artery disease) Recommendation is to proceed with an elective cardiac catheterization early next week, to rule out significant CAD progression. Patient is agreeable with this recommendation, and risks/benefits were discussed and she is willing to proceed. We'll continue current medication regimen, which includes low-dose aspirin and low-dose carvedilol. Patient does have prn NTG. This was also reviewed with, and approved by, Dr. Willa Rough.  HLD (hyperlipidemia) Will reassess lipid status with a FLP/LFT profile. Target LDL 70 or less, if feasible.    Gene [place], PAC

## 2011-09-22 ENCOUNTER — Other Ambulatory Visit: Payer: Self-pay | Admitting: Physician Assistant

## 2011-09-22 DIAGNOSIS — R079 Chest pain, unspecified: Secondary | ICD-10-CM

## 2011-09-22 LAB — PROTIME-INR

## 2011-09-23 ENCOUNTER — Encounter (HOSPITAL_BASED_OUTPATIENT_CLINIC_OR_DEPARTMENT_OTHER)
Admission: RE | Disposition: A | Discharge status: Hospice | Payer: Self-pay | Source: Ambulatory Visit | Attending: Cardiovascular Disease

## 2011-09-23 ENCOUNTER — Ambulatory Visit (HOSPITAL_COMMUNITY)
Admission: AD | Admit: 2011-09-23 | Discharge: 2011-09-24 | Disposition: A | Discharge status: Home / Self Care | Payer: Medicare Other | Source: Ambulatory Visit | Attending: Cardiovascular Disease | Admitting: Cardiovascular Disease

## 2011-09-23 ENCOUNTER — Encounter (HOSPITAL_COMMUNITY): Payer: Self-pay | Admitting: General Practice

## 2011-09-23 ENCOUNTER — Encounter (HOSPITAL_COMMUNITY)
Admission: AD | Disposition: A | Discharge status: Home / Self Care | Payer: Self-pay | Source: Ambulatory Visit | Attending: Cardiovascular Disease

## 2011-09-23 ENCOUNTER — Inpatient Hospital Stay (HOSPITAL_BASED_OUTPATIENT_CLINIC_OR_DEPARTMENT_OTHER)
Admission: RE | Admit: 2011-09-23 | Discharge: 2011-09-23 | Disposition: A | Discharge status: Hospice | Payer: Medicare Other | Source: Ambulatory Visit | Attending: Cardiovascular Disease | Admitting: Cardiovascular Disease

## 2011-09-23 DIAGNOSIS — I2510 Atherosclerotic heart disease of native coronary artery without angina pectoris: Secondary | ICD-10-CM | POA: Insufficient documentation

## 2011-09-23 DIAGNOSIS — R079 Chest pain, unspecified: Secondary | ICD-10-CM

## 2011-09-23 DIAGNOSIS — F3289 Other specified depressive episodes: Secondary | ICD-10-CM | POA: Insufficient documentation

## 2011-09-23 DIAGNOSIS — I428 Other cardiomyopathies: Secondary | ICD-10-CM | POA: Insufficient documentation

## 2011-09-23 DIAGNOSIS — Z9861 Coronary angioplasty status: Secondary | ICD-10-CM | POA: Insufficient documentation

## 2011-09-23 DIAGNOSIS — E785 Hyperlipidemia, unspecified: Secondary | ICD-10-CM | POA: Insufficient documentation

## 2011-09-23 DIAGNOSIS — F329 Major depressive disorder, single episode, unspecified: Secondary | ICD-10-CM | POA: Insufficient documentation

## 2011-09-23 DIAGNOSIS — I2119 ST elevation (STEMI) myocardial infarction involving other coronary artery of inferior wall: Secondary | ICD-10-CM

## 2011-09-23 HISTORY — DX: Acute myocardial infarction, unspecified: I21.9

## 2011-09-23 HISTORY — DX: Adverse effect of unspecified anesthetic, initial encounter: T41.45XA

## 2011-09-23 HISTORY — PX: PERCUTANEOUS CORONARY STENT INTERVENTION (PCI-S): SHX5485

## 2011-09-23 SURGERY — JV LEFT HEART CATHETERIZATION WITH CORONARY ANGIOGRAM
Anesthesia: Moderate Sedation

## 2011-09-23 SURGERY — PERCUTANEOUS CORONARY STENT INTERVENTION (PCI-S)
Anesthesia: LOCAL

## 2011-09-23 MED ORDER — MIDAZOLAM HCL 2 MG/2ML IJ SOLN
INTRAMUSCULAR | Status: AC
  Filled 2011-09-23: qty 2

## 2011-09-23 MED ORDER — CYCLOBENZAPRINE HCL 10 MG PO TABS: 10.0000 mg | ORAL_TABLET | Freq: Two times a day (BID) | ORAL | Status: DC | PRN

## 2011-09-23 MED ORDER — LIDOCAINE-EPINEPHRINE 1 %-1:100000 IJ SOLN
3.0000 mL | Freq: Once | INTRAMUSCULAR | Status: AC
  Administered 2011-09-23: 3 mL via INTRADERMAL

## 2011-09-23 MED ORDER — VENLAFAXINE HCL 75 MG PO TABS
75.0000 mg | ORAL_TABLET | Freq: Every day | ORAL | Status: DC
  Administered 2011-09-24: 11:00:00 75 mg via ORAL
  Filled 2011-09-23 (×2): qty 1

## 2011-09-23 MED ORDER — NITROGLYCERIN 0.4 MG SL SUBL: 0.4000 mg | SUBLINGUAL_TABLET | SUBLINGUAL | Status: DC | PRN

## 2011-09-23 MED ORDER — HEPARIN (PORCINE) IN NACL 2-0.9 UNIT/ML-% IJ SOLN
INTRAMUSCULAR | Status: AC
  Filled 2011-09-23: qty 2000

## 2011-09-23 MED ORDER — ASPIRIN 325 MG PO TABS
325.0000 mg | ORAL_TABLET | Freq: Every day | ORAL | Status: DC
  Administered 2011-09-23 – 2011-09-24 (×2): 325 mg via ORAL
  Filled 2011-09-23 (×3): qty 1

## 2011-09-23 MED ORDER — CLOPIDOGREL BISULFATE 300 MG PO TABS: 600.0000 mg | ORAL_TABLET | Freq: Every day | ORAL | Status: DC

## 2011-09-23 MED ORDER — ACETAMINOPHEN 325 MG PO TABS: 650.0000 mg | ORAL_TABLET | ORAL | Status: DC | PRN

## 2011-09-23 MED ORDER — OMEGA-3-ACID ETHYL ESTERS 1 G PO CAPS
1.0000 g | ORAL_CAPSULE | Freq: Two times a day (BID) | ORAL | Status: DC
  Administered 2011-09-23 – 2011-09-24 (×2): 1 g via ORAL
  Filled 2011-09-23 (×3): qty 1

## 2011-09-23 MED ORDER — SODIUM CHLORIDE 0.9 % IV SOLN: INTRAVENOUS | Status: DC

## 2011-09-23 MED ORDER — SODIUM CHLORIDE 0.9 % IJ SOLN: 3.0000 mL | INTRAMUSCULAR | Status: DC | PRN

## 2011-09-23 MED ORDER — RED YEAST RICE 600 MG PO TABS: 2.0000 | ORAL_TABLET | Freq: Two times a day (BID) | ORAL | Status: DC

## 2011-09-23 MED ORDER — SODIUM CHLORIDE 0.9 % IV SOLN: 250.0000 mL | INTRAVENOUS | Status: DC | PRN

## 2011-09-23 MED ORDER — NITROGLYCERIN 0.2 MG/ML ON CALL CATH LAB
INTRAVENOUS | Status: AC
  Filled 2011-09-23: qty 1

## 2011-09-23 MED ORDER — LIDOCAINE HCL (PF) 1 % IJ SOLN
INTRAMUSCULAR | Status: AC
  Filled 2011-09-23: qty 30

## 2011-09-23 MED ORDER — LIDOCAINE-EPINEPHRINE 1 %-1:100000 IJ SOLN
INTRAMUSCULAR | Status: AC
  Filled 2011-09-23: qty 1

## 2011-09-23 MED ORDER — CLOPIDOGREL BISULFATE 75 MG PO TABS
75.0000 mg | ORAL_TABLET | Freq: Every day | ORAL | Status: DC
  Administered 2011-09-24: 09:00:00 75 mg via ORAL
  Filled 2011-09-23: qty 1

## 2011-09-23 MED ORDER — SODIUM CHLORIDE 0.9 % IJ SOLN: 3.0000 mL | Freq: Two times a day (BID) | INTRAMUSCULAR | Status: DC

## 2011-09-23 MED ORDER — CARVEDILOL 3.125 MG PO TABS
1.5625 mg | ORAL_TABLET | Freq: Two times a day (BID) | ORAL | Status: DC
  Administered 2011-09-24: 11:00:00 1.5625 mg via ORAL
  Filled 2011-09-23 (×2): qty 0.5
  Filled 2011-09-23: qty 1
  Filled 2011-09-23: qty 0.5
  Filled 2011-09-23: qty 1

## 2011-09-23 MED ORDER — LISINOPRIL 2.5 MG PO TABS
2.5000 mg | ORAL_TABLET | Freq: Every day | ORAL | Status: DC
  Administered 2011-09-24: 2.5 mg via ORAL
  Filled 2011-09-23 (×2): qty 1

## 2011-09-23 MED ORDER — OXYCODONE-ACETAMINOPHEN 5-325 MG PO TABS: 1.0000 | ORAL_TABLET | ORAL | Status: DC | PRN

## 2011-09-23 MED ORDER — ONDANSETRON HCL 4 MG/2ML IJ SOLN: 4.0000 mg | Freq: Four times a day (QID) | INTRAMUSCULAR | Status: DC | PRN

## 2011-09-23 MED ORDER — FENTANYL CITRATE 0.05 MG/ML IJ SOLN
INTRAMUSCULAR | Status: AC
  Filled 2011-09-23: qty 2

## 2011-09-23 MED ORDER — DIAZEPAM 5 MG PO TABS
5.0000 mg | ORAL_TABLET | ORAL | Status: AC
  Administered 2011-09-23: 5 mg via ORAL

## 2011-09-23 MED ORDER — SODIUM CHLORIDE 0.9 % IV SOLN
INTRAVENOUS | Status: DC
  Administered 2011-09-23: 12:00:00 via INTRAVENOUS

## 2011-09-23 MED ORDER — ACETAMINOPHEN 325 MG PO TABS
650.0000 mg | ORAL_TABLET | ORAL | Status: DC | PRN
  Administered 2011-09-23: 22:00:00 650 mg via ORAL
  Filled 2011-09-23: qty 2

## 2011-09-23 MED ORDER — ALPRAZOLAM 0.25 MG PO TABS: 0.2500 mg | ORAL_TABLET | Freq: Two times a day (BID) | ORAL | Status: DC | PRN

## 2011-09-23 MED ORDER — SODIUM CHLORIDE 0.9 % IV SOLN: INTRAVENOUS | Status: AC

## 2011-09-23 MED ORDER — MORPHINE SULFATE 2 MG/ML IJ SOLN: 2.0000 mg | INTRAMUSCULAR | Status: DC | PRN

## 2011-09-23 MED ORDER — BIVALIRUDIN 250 MG IV SOLR
INTRAVENOUS | Status: AC
  Filled 2011-09-23: qty 250

## 2011-09-23 MED ORDER — ASPIRIN 81 MG PO CHEW: 324.0000 mg | CHEWABLE_TABLET | ORAL | Status: DC

## 2011-09-23 NOTE — CV Procedure (Signed)
    Cardiac Cath Note  AARIAH GODETTE [id number] 03/12/1946  Procedure: Left Heart Cardiac Catheterization Note Indications: chest pain, hx of LAD stent.  Reversible Inferior defect on myoview scan  Procedure Details Consent: Obtained Time Out: Verified patient identification, verified procedure, site/side was marked, verified correct patient position, special equipment/implants available, Radiology Safety Procedures followed,  medications/allergies/relevent history reviewed, required imaging and test results available.  Performed   Medications: Fentanyl: 50 mcg IV Versed: 2 mg IV Romazicon 0.2 mg IV  The right femoral artery was easily canulated using a modified Seldinger technique.  Hemodynamics:    LV pressure: 174/30 Aortic pressure: 174/87  Angiography   Left Main: The left main is smooth and normal.  Left anterior Descending: there is a long stent in the proximal LAD.  The stent is patent.  There is a small amount of instent restenosis in the distal aspect of the stent - aproximately 30%.   The 1st diag arises before the stent.  The 2nd diag arises in the distal aspect of the stent and has a 40-50% stenosis at it's take-off.  Left Circumflex: The LCx is moderate in size and has minor luminal irregularities  Right Coronary Artery: The RCA is moderate in size and is dominant.  There is a subtotal occlusion in the mid vessel.  There is competetive flow from left to right collaterals  LV Gram: mild - moderate LV dysfunction.  There is apical akinesis and inferior basal akinesis.  The overall EF is 40%.  Complications: No apparent complications Patient did tolerate procedure well.  Conclusions:   1. CAD primarily involving the mid RCA.  Will transfer her to the main lab for PCI of her mid RCA. The LAD stent is patent .  There is a mild amount of ISR in the distal aspect of the stent.    2. Mild-moderate LV dysfunction with EF of 40%.  Akinesis of the apex and inferior  basal wall.  Vesta Mixer, Montez Hageman., MD, [facility] 09/23/2011, 12:43 PM Office - [phone] Pager [phone]

## 2011-09-23 NOTE — CV Procedure (Signed)
   CARDIAC CATH NOTE  Name: Robin [name] MRN: [id number] DOB: Apr 30, 1945  Procedure: PTCA and stenting of the right coronary artery  Indication: This is a 66 year old woman with known coronary artery disease. She has developed progressive chest pain. A nuclear scan demonstrated at least a moderate area of inferolateral ischemia and she was referred for diagnostic cardiac catheterization. Her diagnostic cardiac catheterization in the outpatient lab earlier today demonstrated a very short total occlusion of the mid right coronary artery with some antegrade filling. She was referred for PCI. She's had previous stenting of the LAD and the stents were patent with only mild restenosis  Procedural Details: The right groin was prepped, draped, and anesthetized with 1% lidocaine. The indwelling 4 French sheath was exchanged for a 6 French sheath in the right femoral artery. Weight-based bivalirudin was given for anticoagulation. The patient had been adequately pre-loaded with clopidogrel 600 mg. Once a therapeutic ACT was achieved, a 6 [place] JR 4 guide catheter was inserted.  A cougar coronary guidewire was initially used to cross the lesion.  However, the cougar would not cross. A hydrophilic whisper wire was utilized and with a moderate amount of difficulty the lesion was crossed. The lesion was predilated with a 2.0 x 12 mm balloon.  The lesion was then stented with a 3.0 x 15 mm Xience expedition drug-eluting stent.  The stent appeared well expanded and a little bit oversized for the vessel. Therefore, the stented segment was not postdilated. Following PCI, there was 0% residual stenosis and TIMI-3 flow. Final angiography confirmed an excellent result. The patient tolerated the procedure well. There were no immediate procedural complications. Femoral hemostasis will be achieved with manual hemostasis.. The patient was transferred to the post catheterization recovery area for further monitoring.  Lesion  Data: Vessel: RCA/mid Percent stenosis (pre): 99 TIMI-flow (pre):  2 Stent:  3.0 x 15 mm drug-eluting Percent stenosis (post): 0 TIMI-flow (post): 3  Conclusions: Successful PCI of the mid right coronary artery as above  Recommendations: Dual antiplatelet therapy with aspirin and Plavix for a minimum of 12 months  Tonny Bollman 09/23/2011, 2:46 PM

## 2011-09-23 NOTE — Interval H&P Note (Signed)
History and Physical Interval Note:  09/23/2011 1:49 PM  ALVIS PULCINI  has presented today for surgery, with the diagnosis of blockage  The various methods of treatment have been discussed with the patient and family. After consideration of risks, benefits and other options for treatment, the patient has consented to  Procedure(s) (LRB): PERCUTANEOUS CORONARY STENT INTERVENTION (PCI-S) (N/A) as a surgical intervention .  The patient's history has been reviewed, patient examined, no change in status, stable for surgery.  I have reviewed the patients' chart and labs.  Questions were answered to the patient's satisfaction.    Diagnostic cath films were reviewed. The patient has critical stenosis of the right coronary artery and she appears to be a good candidate for PCI. I have reviewed the procedural risks, indications, and alternatives in detail and she agrees to proceed. She has been loaded with clopidogrel 600 mg.   Tonny Bollman  09/23/2011 1:49 PM

## 2011-09-23 NOTE — Progress Notes (Signed)
PHARMACIST - PHYSICIAN ORDER COMMUNICATION  CONCERNING: P&T Medication Policy on Herbal Medications  DESCRIPTION:  This patient's order for:  Red Rice Yeast  has been noted.  This product(s) is classified as an "herbal" or natural product. Due to a lack of definitive safety studies or FDA approval, nonstandard manufacturing practices, plus the potential risk of unknown drug-drug interactions while on inpatient medications, the Pharmacy and Therapeutics Committee does not permit the use of "herbal" or natural products of this type within [facility].   ACTION TAKEN: The pharmacy department is unable to verify this order at this time and your patient has been informed of this safety policy. Please reevaluate patient's clinical condition at discharge and address if the herbal or natural product(s) should be resumed at that time.   Onis Markoff K. Allena Katz, PharmD, BCPS.  Clinical Pharmacist Pager [phone]. 09/23/2011 6:23 PM

## 2011-09-23 NOTE — H&P (View-Only) (Signed)
Primary Cardiologist: Guy DeGent, MD   HPI: Patient presents as post hospital followup from [facility], following recent brief stay for evaluation of CP. Seen in consultation by cardiology fellow on Sunday, ruled out for MI with normal cardiac markers, and referred for outpatient stress testing. This was performed on June 21, and reviewed by Dr. DeGent. It was an adequate GXT stress test (PMH R. 87%), with no associated CP or significant EKG changes. Perfusion imaging, however, yielded a medium, completely reversible inferior/inferoseptal defect, consistent with ischemia; there was a second, large fixed anterior defect with associated severe HK, consistent with prior MI.  These results were reviewed with the patient today. She has since had occasional brief spells of CP, as well as back pain, the latter reminiscent of her MI presentation in 2007, and which precipitated her recent hospitalization. Her symptoms are not strictly correlated with exertion, however. Again, she did not experience similar symptoms during recent GXT testing. She has not had to use any of her prn NTG.  Allergies  Allergen Reactions  . Prednisolone Acetate Other (See Comments)    Panic attacks    Current Outpatient Prescriptions  Medication Sig Dispense Refill  . aspirin 325 MG tablet Take 325 mg by mouth daily.        . carvedilol (COREG) 3.125 MG tablet Take 1/2 tab daily  45 tablet  3  . lisinopril (PRINIVIL,ZESTRIL) 5 MG tablet TAKE 1/2 TABLET BY MOUTH ONCE A DAY  45 tablet  3  . nitroGLYCERIN (NITROSTAT) 0.4 MG SL tablet Place 1 tablet (0.4 mg total) under the tongue every 5 (five) minutes as needed for chest pain.  25 tablet  3  . Omega-3 Fatty Acids (FISH OIL) 1000 MG CAPS Take 1 capsule by mouth daily.        . Red Yeast Rice 600 MG TABS Take 2 tablets by mouth 2 (two) times daily.        . venlafaxine (EFFEXOR) 75 MG tablet Take 75 mg by mouth daily.          Past Medical History  Diagnosis Date  . HLD  (hyperlipidemia)     mixed  . Cardiomyopathy     Stable with normal ejection fraction  . CAD (coronary artery disease)     Status post stent to the LAD in 2007 post anterior wall myocardial infarction normal exercise Cardiolite study 2011 no ischemia with mid to distal anterior defect fixed ejection fraction 63%.  . Major depressive disorder     Past Surgical History  Procedure Date  . Breast enhancement surgery   . Bilateral tubal ligation     History   Social History  . Marital Status: Divorced    Spouse Name: N/A    Number of Children: N/A  . Years of Education: N/A   Occupational History  . Not on file.   Social History Main Topics  . Smoking status: Former Smoker -- 1.0 packs/day for 30 years    Types: Cigarettes    Quit date: 10/15/2005  . Smokeless tobacco: Never Used  . Alcohol Use: No  . Drug Use: No  . Sexually Active: Not on file   Other Topics Concern  . Not on file   Social History Narrative   Full time. Single. Does not get regular exercise.    Family History  Problem Relation Age of Onset  . Coronary artery disease      family hx  . Hyperlipidemia      family hx  .   Hypertension      family hx  . Thyroid disease      family hx    ROS: no nausea, vomiting; no fever, chills; no melena, hematochezia; no claudication  PHYSICAL EXAM: BP 102/63  Pulse 81  Ht 5' 7" (1.702 m)  Wt 131 lb (59.421 kg)  BMI 20.52 kg/m2 GENERAL: 65-year-old female, sitting upright; NAD HEENT: NCAT, PERRLA, EOMI; sclera clear; no xanthelasma NECK: palpable bilateral carotid pulses, no bruits; no JVD; no TM LUNGS: CTA bilaterally CARDIAC: RRR (S1, S2); no significant murmurs; no rubs or gallops ABDOMEN: soft, non-tender; intact BS EXTREMETIES: intact femoral and distal pulses; no femoral bruits; no significant peripheral edema SKIN: warm/dry; no obvious rash/lesions MUSCULOSKELETAL: no joint deformity NEURO: no focal deficit; NL affect   EKG:    ASSESSMENT &  PLAN:  CAD (coronary artery disease) Recommendation is to proceed with an elective cardiac catheterization early next week, to rule out significant CAD progression. Patient is agreeable with this recommendation, and risks/benefits were discussed and she is willing to proceed. We'll continue current medication regimen, which includes low-dose aspirin and low-dose carvedilol. Patient does have prn NTG. This was also reviewed with, and approved by, Dr. Jeffrey Katz.  HLD (hyperlipidemia) Will reassess lipid status with a FLP/LFT profile. Target LDL 70 or less, if feasible.    Gene Janelys Glassner, PAC  

## 2011-09-24 ENCOUNTER — Encounter (HOSPITAL_COMMUNITY): Payer: Self-pay | Admitting: Physician Assistant

## 2011-09-24 DIAGNOSIS — I2510 Atherosclerotic heart disease of native coronary artery without angina pectoris: Secondary | ICD-10-CM

## 2011-09-24 LAB — BASIC METABOLIC PANEL
CO2: 26 mEq/L (ref 19–32)
Chloride: 105 mEq/L (ref 96–112)
Creatinine, Ser: 0.78 mg/dL (ref 0.50–1.10)
Potassium: 5 mEq/L (ref 3.5–5.1)

## 2011-09-24 LAB — CBC
HCT: 37.5 % (ref 36.0–46.0)
MCH: 30.1 pg (ref 26.0–34.0)
MCHC: 33.6 g/dL (ref 30.0–36.0)
MCV: 89.5 fL (ref 78.0–100.0)
Platelets: 277 10*3/uL (ref 150–400)
RDW: 12.8 % (ref 11.5–15.5)
WBC: 5.7 10*3/uL (ref 4.0–10.5)

## 2011-09-24 LAB — POCT ACTIVATED CLOTTING TIME: Activated Clotting Time: 434 seconds

## 2011-09-24 MED ORDER — CLOPIDOGREL BISULFATE 75 MG PO TABS: 75.0000 mg | ORAL_TABLET | Freq: Every day | ORAL | Status: DC

## 2011-09-24 MED ORDER — CARVEDILOL 3.125 MG PO TABS: 3.1250 mg | ORAL_TABLET | Freq: Two times a day (BID) | ORAL | Status: DC

## 2011-09-24 MED ORDER — ASPIRIN 81 MG PO TABS: 81.0000 mg | ORAL_TABLET | Freq: Every day | ORAL | Status: AC

## 2011-09-24 MED FILL — Dextrose Inj 5%: INTRAVENOUS | Qty: 1000 | Status: AC

## 2011-09-24 NOTE — Progress Notes (Signed)
CARDIAC REHAB PHASE I   PRE:  Rate/Rhythm: 71SR    BP: sitting 104/70    SaO2:   MODE:  Ambulation: 740 ft   POST:  Rate/Rhythm: 96 SR    BP: sitting 112/71     SaO2:   Tolerated great, no c/o. Vss. Reviewed ed. Pt in maintenance CR and feel she does not need CRPII, can resume maintenance.  [id number]  Robin [name] CES, ACSM

## 2011-09-24 NOTE — Discharge Summary (Signed)
Discharge Summary   Patient ID: Robin [name] MRN: [id number], DOB/AGE: Jan 04, 1946 66 y.o. Admit date: 09/23/2011 D/C date:     09/24/2011  Primary Cardiologist: Dr. Andee Lineman  Primary Discharge Diagnoses:  1. CAD - s/p DES to RCA this admission (following abnormal stress test) - anterior wall MI s/p LAD stent 2007 2. Cardiomyopathy - EF 40% by cath this admission -previous EF 40% by cath, 50% by echo 09/2005   Secondary Discharge Diagnoses:  1. HLD, intolerant to statins 2. Major depressive disorder  Hospital Course: [name] is a 66 y/o F with hx of CAD s/p anterior MI 2007 who presented to the [facility] office 09/19/11 in follow-up for abnormal stress test. She was recently admitted for evaluation of CP and referred for outpatient stress testing. This was performed on June 21, and reviewed by Dr. Andee Lineman. It was an adequate GXT stress test (PMH R. 87%), with no associated CP or significant EKG changes. Perfusion imaging, however, yielded a medium, completely reversible inferior/inferoseptal defect, consistent with ischemia; there was a second, large fixed anterior defect with associated severe HK, consistent with prior MI. The patient reported occasional CP as well as back pain, the latter reminiscent of her MI in 2007. Her symptoms were not strictly correlated with exertion. Cardiac cath was recommended to define her anatomy. She was brought in for this procedure 09/23/11 demonstrating subtotal occlusion in mid RCA with otherwise nonobstructive disease in the LAD, minor luminal irregularities in the LCx. EF was 40% with akinesis of apex and inferior wall. Dr. Excell Seltzer performed sucessful PCI with DES to the RCA.  He recommended dual antiplatelet therapy with aspirin and Plavix for a minimum of 12 months. She tolerated this procedure well. Dr. Elease Hashimoto has seen and examined her today and feels she is stable for discharge. Per our discussion, her Coreg will be made 3.125mg  BID and lisinopril left at 2.5mg  daily  given current blood pressure. Due to scheduling changes with the [facility] office, her post-hospital visit will be made for [facility].  Discharge Vitals: Blood pressure 103/68, pulse 85, temperature 97.8 F (36.6 C), temperature source Oral, resp. rate 16, height 5\' 7"  (1.702 m), weight 130 lb 1.1 oz (59 kg), SpO2 98.00%.  Labs: Lab Results  Component Value Date   WBC 5.7 09/24/2011   HGB 12.6 09/24/2011   HCT 37.5 09/24/2011   MCV 89.5 09/24/2011   PLT 277 09/24/2011     Lab 09/24/11 0650  NA 141  K 5.0  CL 105  CO2 26  BUN 7  CREATININE 0.78  CALCIUM 9.4  PROT --  BILITOT --  ALKPHOS --  ALT --  AST --  GLUCOSE 93   Diagnostic Studies/Procedures   1. Cardiac catheterization this admission, please see full report and above for summary.  Discharge Medications   Medication List  As of 09/24/2011 12:59 PM   TAKE these medications         ALPRAZolam 0.25 MG tablet   Commonly known as: XANAX   Take 1 tablet by mouth Twice daily as needed. For anxiety      aspirin 81 MG tablet   Take 1 tablet (81 mg total) by mouth daily.      carvedilol 3.125 MG tablet   Commonly known as: COREG   Take 1 tablet (3.125 mg total) by mouth 2 (two) times daily with a meal.      clopidogrel 75 MG tablet   Commonly known as: PLAVIX   Take 1 tablet (75  mg total) by mouth daily with breakfast.      cyclobenzaprine 10 MG tablet   Commonly known as: FLEXERIL   Take 1 tablet by mouth Twice daily as needed. For muscle spasms.      Fish Oil 1000 MG Caps   Take 1 capsule by mouth daily.      lisinopril 5 MG tablet   Commonly known as: PRINIVIL,ZESTRIL   Take 2.5 mg by mouth daily.      nitroGLYCERIN 0.4 MG SL tablet   Commonly known as: NITROSTAT   Place 0.4 mg under the tongue every 5 (five) minutes as needed. For chest pain.      Red Yeast Rice 600 MG Tabs   Take 2 tablets by mouth 2 (two) times daily.      venlafaxine 75 MG tablet   Commonly known as: EFFEXOR   Take 75 mg by mouth  daily.            Disposition   The patient will be discharged in stable condition to home. Discharge Orders    Future Appointments: Provider: Department: Dept Phone: Center:   10/09/2011 9:50 AM Beatrice Lecher, PA [facility] [phone] [facility]     Future Orders Please Complete By Expires   Diet - low sodium heart healthy      Increase activity slowly      Comments:   No driving for 2 days. No lifting over 5 lbs for 1 week. No sexual activity for 1 week. Keep procedure site clean & dry. If you notice increased pain, swelling, bleeding or pus, call/return!  You may shower, but no soaking baths/hot tubs/pools for 1 week.     Follow-up Information    Follow up with Tereso Newcomer, PA. (Due to scheduling changes, your post-hospital visit will be with Tereso Newcomer PA-C on 10/09/11 at 9:50 in [place]. We apologize for the inconvenience. Afterwards you will continue to follow in [place].)    Contact information:   [address] [place] [address] [phone]            Duration of Discharge Encounter: Greater than 30 minutes including physician and PA time.  Signed, Ronie Spies PA-C 09/24/2011, 12:59 PM  Attending Note:   The patient was seen and examined.  Agree with assessment and plan as noted above.  See my note from earlier today.  Vesta Mixer, Montez Hageman., MD, [facility] 09/24/2011, 1:52 PM

## 2011-09-24 NOTE — Progress Notes (Signed)
   PROGRESS NOTE  Subjective:   Pt is doing well after PCI of her mid RCA by Dr. Excell Seltzer.  Objective:    Vital Signs:   Temp:  [97.7 F (36.5 C)-98.3 F (36.8 C)] 97.8 F (36.6 C) (07/10 0507) Pulse Rate:  [60-85] 85  (07/10 0507) Resp:  [12-19] 16  (07/10 0507) BP: (100-131)/(56-90) 103/68 mmHg (07/10 0507) SpO2:  [97 %-100 %] 98 % (07/10 0507) Weight:  [130 lb 1.1 oz (59 kg)-131 lb (59.421 kg)] 130 lb 1.1 oz (59 kg) (07/09 2000)      24-hour weight change: Weight change:   Weight trends: Filed Weights   09/23/11 2000  Weight: 130 lb 1.1 oz (59 kg)    Intake/Output:  07/09 0701 - 07/10 0700 In: 240 [P.O.:240] Out: 650 [Urine:650]     Physical Exam: BP 103/68  Pulse 85  Temp 97.8 F (36.6 C) (Oral)  Resp 16  Ht 5\' 7"  (1.702 m)  Wt 130 lb 1.1 oz (59 kg)  BMI 20.37 kg/m2  SpO2 98%  General: Vital signs reviewed and noted. Well-developed, well-nourished, in no acute distress; alert, appropriate and cooperative .  Head: Normocephalic, atraumatic.  Eyes: conjunctivae/corneas clear.  EOM's intact.   Throat: normal  Neck: Supple. Normal carotids. No JVD  Lungs:  Clear to auscultation  Heart: Regular rate,  With normal  S1 S2. No murmurs, gallops or rubs  Abdomen:  Soft, non-tender, non-distended with normoactive bowel sounds. No hepatomegaly. No rebound/guarding. No abdominal masses.  Extremities: Distal pedal pulses are 2+ .  No edema.    Neurologic: A&O X3, CN II - XII are grossly intact. Motor strength is 5/5 in the all 4 extremities.  Psych: Responds to questions appropriately with normal affect.    Labs: BMET: No results found for this basename: NA:2,K:2,CL:2,CO2:2,GLUCOSE:2,BUN:2,CREATININE:2,CALCIUM:2,MG:2,PHOS:2 in the last 72 hours  Liver function tests: No results found for this basename: AST:2,ALT:2,ALKPHOS:2,BILITOT:2,PROT:2,ALBUMIN:2 in the last 72 hours No results found for this basename: LIPASE:2,AMYLASE:2 in the last 72 hours  CBC: No  results found for this basename: WBC:2,NEUTROABS:2,HGB:2,HCT:2,MCV:2,PLT:2 in the last 72 hours  Cardiac Enzymes: No results found for this basename: CKTOTAL:4,CKMB:4,TROPONINI:4 in the last 72 hours  Coagulation Studies: No results found for this basename: LABPROT:5,INR:5 in the last 72 hours    Tele:   Medications:    Infusions:    . sodium chloride      Scheduled Medications:    . aspirin  325 mg Oral Daily  . carvedilol  1.5625 mg Oral BID WC  . clopidogrel  75 mg Oral Q breakfast  . lidocaine-EPINEPHrine  3 mL Intradermal Once  . lisinopril  2.5 mg Oral Daily  . omega-3 acid ethyl esters  1 g Oral BID  . venlafaxine  75 mg Oral Daily  . DISCONTD: Red Yeast Rice  2 tablet Oral BID    Assessment/ Plan:    1. CAD:  S/p stenting of mid RCA.  She will need ASA and Plavix for >1 year.  Asa indef. Will need continued efforts with her cholesterol control - she is intolerat to statins.  DC to home today. plavix ntg SL prn. Coreg 3.123 bid effexor Lisinopril 5 qd Xanax prn. Red yeast rice    DC to home Length of Stay: 1  Vesta Mixer, Montez Hageman., MD, [facility] 09/24/2011, 7:29 AM Office [phone] Pager [phone]

## 2011-09-25 NOTE — H&P (Signed)
Primary Cardiologist: Lewayne Bunting, MD     HPI: Patient presents as post hospital followup from [facility], following recent brief stay for evaluation of CP. Seen in consultation by cardiology fellow on Sunday, ruled out for MI with normal cardiac markers, and referred for outpatient stress testing. This was performed on June 21, and reviewed by Dr. Andee Lineman. It was an adequate GXT stress test (PMH R. 87%), with no associated CP or significant EKG changes. Perfusion imaging, however, yielded a medium, completely reversible inferior/inferoseptal defect, consistent with ischemia; there was a second, large fixed anterior defect with associated severe HK, consistent with prior MI.   These results were reviewed with the patient today. She has since had occasional brief spells of CP, as well as back pain, the latter reminiscent of her MI presentation in 2007, and which precipitated her recent hospitalization. Her symptoms are not strictly correlated with exertion, however. Again, she did not experience similar symptoms during recent GXT testing. She has not had to use any of her prn NTG.    Allergies   Allergen  Reactions   .  Prednisolone Acetate  Other (See Comments)       Panic attacks       Current Outpatient Prescriptions   Medication  Sig  Dispense  Refill   .  aspirin 325 MG tablet  Take 325 mg by mouth daily.           .  carvedilol (COREG) 3.125 MG tablet  Take 1/2 tab daily   45 tablet   3   .  lisinopril (PRINIVIL,ZESTRIL) 5 MG tablet  TAKE 1/2 TABLET BY MOUTH ONCE A DAY   45 tablet   3   .  nitroGLYCERIN (NITROSTAT) 0.4 MG SL tablet  Place 1 tablet (0.4 mg total) under the tongue every 5 (five) minutes as needed for chest pain.   25 tablet   3   .  Omega-3 Fatty Acids (FISH OIL) 1000 MG CAPS  Take 1 capsule by mouth daily.           .  Red Yeast Rice 600 MG TABS  Take 2 tablets by mouth 2 (two) times daily.           Marland Kitchen  venlafaxine (EFFEXOR) 75 MG tablet  Take 75 mg by mouth daily.                 Past Medical History   Diagnosis  Date   .  HLD (hyperlipidemia)         mixed   .  Cardiomyopathy         Stable with normal ejection fraction   .  CAD (coronary artery disease)         Status post stent to the LAD in 2007 post anterior wall myocardial infarction normal exercise Cardiolite study 2011 no ischemia with mid to distal anterior defect fixed ejection fraction 63%.   .  Major depressive disorder         Past Surgical History   Procedure  Date   .  Breast enhancement surgery     .  Bilateral tubal ligation         History       Social History   .  Marital Status:  Divorced       Spouse Name:  N/A       Number of Children:  N/A   .  Years of Education:  N/A  Occupational History   .  Not on file.       Social History Main Topics   .  Smoking status:  Former Smoker -- 1.0 packs/day for 30 years       Types:  Cigarettes       Quit date:  10/15/2005   .  Smokeless tobacco:  Never Used   .  Alcohol Use:  No   .  Drug Use:  No   .  Sexually Active:  Not on file       Other Topics  Concern   .  Not on file       Social History Narrative     Full time. Single. Does not get regular exercise.      Family History   Problem  Relation  Age of Onset   .  Coronary artery disease           family hx   .  Hyperlipidemia           family hx   .  Hypertension           family hx   .  Thyroid disease           family hx      ROS: no nausea, vomiting; no fever, chills; no melena, hematochezia; no claudication   PHYSICAL EXAM: BP 102/63  Pulse 81  Ht 5\' 7"  (1.702 m)  Wt 131 lb (59.421 kg)  BMI 20.52 kg/m2 GENERAL: 66 year old female, sitting upright; NAD HEENT: NCAT, PERRLA, EOMI; sclera clear; no xanthelasma NECK: palpable bilateral carotid pulses, no bruits; no JVD; no TM LUNGS: CTA bilaterally CARDIAC: RRR (S1, S2); no significant murmurs; no rubs or gallops ABDOMEN: soft, non-tender; intact BS EXTREMETIES: intact femoral and distal  pulses; no femoral bruits; no significant peripheral edema SKIN: warm/dry; no obvious rash/lesions MUSCULOSKELETAL: no joint deformity NEURO: no focal deficit; NL affect     EKG:      ASSESSMENT & PLAN:   CAD (coronary artery disease) Recommendation is to proceed with an elective cardiac catheterization early next week, to rule out significant CAD progression. Patient is agreeable with this recommendation, and risks/benefits were discussed and she is willing to proceed. We'll continue current medication regimen, which includes low-dose aspirin and low-dose carvedilol. Patient does have prn NTG. This was also reviewed with, and approved by, Dr. Willa Rough.   HLD (hyperlipidemia) Will reassess lipid status with a FLP/LFT profile. Target LDL 70 or less, if feasible.     Gene Serpe, PAC  Attending Note:   The patient was seen and examined on 09/23/11. She agrees to have cardiac cath.  Agree with assessment and plan as noted above.  Vesta Mixer, Montez Hageman., MD, [facility] 09/25/2011, 11:46 AM

## 2011-10-09 ENCOUNTER — Ambulatory Visit (INDEPENDENT_AMBULATORY_CARE_PROVIDER_SITE_OTHER): Payer: Medicare Other | Admitting: Physician Assistant

## 2011-10-09 ENCOUNTER — Encounter: Payer: Self-pay | Admitting: Physician Assistant

## 2011-10-09 VITALS — BP 124/70 | HR 58 | Ht 67.0 in | Wt 128.0 lb

## 2011-10-09 DIAGNOSIS — E785 Hyperlipidemia, unspecified: Secondary | ICD-10-CM

## 2011-10-09 DIAGNOSIS — I2510 Atherosclerotic heart disease of native coronary artery without angina pectoris: Secondary | ICD-10-CM

## 2011-10-09 DIAGNOSIS — I2589 Other forms of chronic ischemic heart disease: Secondary | ICD-10-CM

## 2011-10-09 DIAGNOSIS — L989 Disorder of the skin and subcutaneous tissue, unspecified: Secondary | ICD-10-CM

## 2011-10-09 NOTE — Patient Instructions (Addendum)
Your physician recommends that you schedule a follow-up appointment in: 3 MONTHS WITH DR. Andee Lineman IN THE [facility] OFFICE  NO CHANGES WERE MADE TODAY

## 2011-10-09 NOTE — Progress Notes (Signed)
[address] [place], [place]  [address] Phone: [phone] Fax:  [phone]  Date:  10/09/2011   Name:  Robin [name]   DOB:  1945/11/23   MRN:  [id number]  PCP:  Ignatius Specking., MD  Primary Cardiologist:  Dr. Lewayne Bunting  Primary Electrophysiologist:  None    History of Present Illness: Robin [name] is a 66 y.o. female who returns for post hospital follow up.  She has a history of CAD, status post stenting to the LAD in 2007 in the setting of anterior wall MI, depression, HL.  She was recently admitted to [facility] with chest pain and underwent outpatient stress testing.  This was abnormal with inferior/inferoseptal defect consistent with ischemia and a large fixed anterior defect with associated severe HK consistent with prior MI.  She was therefore set up for outpatient cardiac catheterization.  LHC 09/23/11: Proximal LAD stent patent with 30% ISR, D2 40-50%, circumflex luminal irregularities, mid RCA subtotal occlusion, EF 40% with apical HK and inferior AK.  She underwent PCI with a Xience DES to the mid RCA.  Post PCI course was uneventful.  Doing well.  The patient denies chest pain, shortness of breath, syncope, orthopnea, PND or significant pedal edema.  Feels dizzy sometimes.  Plans to go to [place] for one month in September.  She does have a lesion on her right leg that will not heal.     Past Medical History  Diagnosis Date  . HLD (hyperlipidemia)     Mixed. Intolerant to statins.  . Ischemic cardiomyopathy     a. Previous EF 40% by cath, 50% by echo 09/2005. b. EF 40% by cath 09/2011.; EF 59% by nuclear study before cath (6/13)  . CAD (coronary artery disease)     a. anterior wall MI s/p LAD stent 2007. b. [place] s/p DES to RCA 09/2011 (following abnormal stress test).  . Major depressive disorder   . Complication of anesthesia 1980    "w/tubal, took me long time to wakeup; I've very sensitive to meds"  . Myocardial infarction 2007    Current  Outpatient Prescriptions  Medication Sig Dispense Refill  . ALPRAZolam (XANAX) 0.25 MG tablet Take 1 tablet by mouth Twice daily as needed. For anxiety      . aspirin 81 MG tablet Take 1 tablet (81 mg total) by mouth daily.      . carvedilol (COREG) 3.125 MG tablet Take by mouth daily. Take one half tablet daily by mouth      . clopidogrel (PLAVIX) 75 MG tablet Take 1 tablet (75 mg total) by mouth daily with breakfast.  30 tablet  6  . cyclobenzaprine (FLEXERIL) 10 MG tablet Take 1 tablet by mouth Twice daily as needed. For muscle spasms.      Robin Kitchen lisinopril (PRINIVIL,ZESTRIL) 5 MG tablet Take 2.5 mg by mouth daily.      . nitroGLYCERIN (NITROSTAT) 0.4 MG SL tablet Place 0.4 mg under the tongue every 5 (five) minutes as needed. For chest pain.      . Omega-3 Fatty Acids (FISH OIL) 1000 MG CAPS Take 1 capsule by mouth daily.        . Red Yeast Rice 600 MG TABS Take 2 tablets by mouth 2 (two) times daily.        Robin [name]: [name]  Allergen Reactions  . Prednisolone  Acetate Other (See Comments)    Panic attacks    History  Substance Use Topics  . Smoking status: Former Smoker -- 1.0 packs/day for 30 years    Types: Cigarettes    Quit date: 10/15/2005  . Smokeless tobacco: Never Used  . Alcohol Use: Yes     09/23/11 "drink maybe 2 mixed drinks/month"     PHYSICAL EXAM: VS:  BP 124/70  Pulse 58  Ht 5\' 7"  (1.702 m)  Wt 128 lb (58.06 kg)  BMI 20.05 kg/m2 Well nourished, well developed, in no acute distress HEENT: normal Neck: no JVD Cardiac:  normal S1, S2; RRR; no murmur Lungs:  clear to auscultation bilaterally, no wheezing, rhonchi or rales Abd: soft, nontender, no hepatomegaly Ext: no edema; right groin without hematoma or bruit  Skin: warm and dry; small papule on lateral right lower leg with excoriation Neuro:  CNs 2-12 intact, no focal abnormalities noted  EKG:  Sinus brady, HR 58, normal axis,  PRWP, no ischemic changes.      ASSESSMENT AND PLAN:  1.  CAD Stable.  No angina.  Doing well post PCI. We discussed the importance of dual antiplatelet therapy.  She continues to do cardiac rehab on her own. Follow up with Dr. Lewayne Bunting in 3 mos.   2.  Ischemic Cardiomyopathy She cannot tolerate more than her present dose of coreg.  Continue Lisinopril. Follow up in 3 mos. EF was better on nuclear study than cath. May need follow up echo arranged at follow up visit in 3 mos to reassess.  Defer this to Dr. Andee Lineman.  3.  Hyperlipidemia She remains on Red Yeast Rice only.  4.  Skin Lesion This appears concerning. I have asked her to schedule a follow up with her PCP and she agrees.  Signed, Tereso Newcomer, PA-C  10:11 AM 10/09/2011

## 2011-11-09 ENCOUNTER — Encounter: Payer: Self-pay | Admitting: Cardiology

## 2011-11-09 DIAGNOSIS — R55 Syncope and collapse: Secondary | ICD-10-CM

## 2012-01-07 ENCOUNTER — Ambulatory Visit: Payer: Medicare Other | Admitting: Cardiology

## 2012-01-08 ENCOUNTER — Other Ambulatory Visit: Payer: Self-pay | Admitting: Physician Assistant

## 2012-01-12 ENCOUNTER — Other Ambulatory Visit: Payer: Self-pay

## 2012-01-12 MED ORDER — CARVEDILOL 3.125 MG PO TABS: 3.1250 mg | ORAL_TABLET | Freq: Every day | ORAL | Status: DC

## 2012-01-13 ENCOUNTER — Telehealth: Payer: Self-pay | Admitting: *Deleted

## 2012-01-13 NOTE — Telephone Encounter (Signed)
Directions corrected to last rx sent which was 1/2 tablet daily.

## 2012-05-14 ENCOUNTER — Other Ambulatory Visit: Payer: Self-pay | Admitting: Cardiology

## 2012-08-22 ENCOUNTER — Other Ambulatory Visit: Payer: Self-pay | Admitting: Physician Assistant

## 2012-08-23 ENCOUNTER — Other Ambulatory Visit: Payer: Self-pay | Admitting: *Deleted

## 2012-08-23 NOTE — Telephone Encounter (Signed)
Left message to return call 

## 2012-08-24 ENCOUNTER — Telehealth: Payer: Self-pay | Admitting: *Deleted

## 2012-08-24 NOTE — Telephone Encounter (Signed)
Spoke with patient and she states that she did establish with Degent in his new office. Nurse advised patient to call [facility] Cardiology Department for refills of medications.

## 2012-08-30 ENCOUNTER — Encounter: Payer: Self-pay | Admitting: Cardiology

## 2012-08-30 ENCOUNTER — Ambulatory Visit (INDEPENDENT_AMBULATORY_CARE_PROVIDER_SITE_OTHER): Payer: Medicare Other | Admitting: Cardiology

## 2012-08-30 VITALS — BP 110/69 | HR 72 | Ht 67.0 in | Wt 134.0 lb

## 2012-08-30 DIAGNOSIS — E785 Hyperlipidemia, unspecified: Secondary | ICD-10-CM

## 2012-08-30 DIAGNOSIS — I2510 Atherosclerotic heart disease of native coronary artery without angina pectoris: Secondary | ICD-10-CM

## 2012-08-30 DIAGNOSIS — I2589 Other forms of chronic ischemic heart disease: Secondary | ICD-10-CM

## 2012-08-30 MED ORDER — LISINOPRIL 5 MG PO TABS: 2.5000 mg | ORAL_TABLET | Freq: Every day | ORAL | Status: DC

## 2012-08-30 MED ORDER — NITROGLYCERIN 0.4 MG SL SUBL: 0.4000 mg | SUBLINGUAL_TABLET | SUBLINGUAL | Status: DC | PRN

## 2012-08-30 MED ORDER — CARVEDILOL 3.125 MG PO TABS: 3.1250 mg | ORAL_TABLET | Freq: Two times a day (BID) | ORAL | Status: DC

## 2012-08-30 NOTE — Patient Instructions (Addendum)

## 2012-08-30 NOTE — Progress Notes (Signed)
HPI The patient presents for follow up of CAD.  She is status post stenting to the LAD in 2007 in the setting of anterior wall MI, depression, HL. In 2013 she was in [facility] with chest pain and underwent outpatient stress testing. This was abnormal with inferior/inferoseptal defect consistent with ischemia and a large fixed anterior defect with associated severe HK consistent with prior MI. She was therefore set up for outpatient cardiac catheterization on 09/23/11 which demonstrated  proximal LAD stent patent with 30% ISR, D2 40-50%, circumflex luminal irregularities, mid RCA subtotal occlusion, EF 40% with apical HK and inferior AK. She underwent PCI with a Xience DES to the mid RCA. Post PCI course was uneventful.  Since that hospitalization she has done well. The patient denies any new symptoms such as chest discomfort, neck or arm discomfort. There has been no new shortness of breath, PND or orthopnea. There have been no reported palpitations, presyncope or syncope. She is very active in her yard. She watches her diet. She has not of the symptoms that was her previous angina.   Allergies  Allergen Reactions  . Prednisolone Acetate Other (See Comments)    Panic attacks    Current Outpatient Prescriptions  Medication Sig Dispense Refill  . aspirin 81 MG tablet Take 1 tablet (81 mg total) by mouth daily.      . carvedilol (COREG) 3.125 MG tablet Take 3.125 mg by mouth 2 (two) times daily with a meal.       . Flaxseed, Linseed, (FLAX SEED OIL PO) Take 1 each by mouth daily.      Marland Kitchen lisinopril (PRINIVIL,ZESTRIL) 5 MG tablet Take 2.5 mg by mouth daily.      . nitroGLYCERIN (NITROSTAT) 0.4 MG SL tablet Place 0.4 mg under the tongue every 5 (five) minutes as needed. For chest pain.      . Omega-3 Fatty Acids (FISH OIL) 1000 MG CAPS Take 1 capsule by mouth daily.        . Red Yeast Rice 600 MG TABS Take 2 tablets by mouth 2 (two) times daily.        Marland Kitchen venlafaxine (EFFEXOR) 75 MG tablet  Take 75 mg by mouth daily.         No current facility-administered medications for this visit.    Past Medical History  Diagnosis Date  . HLD (hyperlipidemia)     Mixed. Intolerant to statins.  . Ischemic cardiomyopathy     a. Previous EF 40% by cath, 50% by echo 09/2005. b. EF 40% by cath 09/2011.; EF 59% by nuclear study before cath (6/13)  . CAD (coronary artery disease)     a. anterior wall MI s/p LAD stent 2007. b. [place] s/p DES to RCA 09/2011 (following abnormal stress test).  . Major depressive disorder   . Complication of anesthesia 1980    "w/tubal, took me long time to wakeup; I've very sensitive to meds"  . Myocardial infarction 2007    Past Surgical History  Procedure Laterality Date  . Coronary angioplasty with stent placement  ~ 2007; 09/23/11  . Tubal ligation  1980  . Augmentation mammaplasty  1992  . Cataract extraction w/ intraocular lens  implant, bilateral  ~ 2008    ROS:  As stated in the HPI and negative for all other systems.  PHYSICAL EXAM BP 110/69  Pulse 72  Ht 5\' 7"  (1.702 m)  Wt 134 lb (60.782 kg)  BMI 20.98 kg/m2 GENERAL:  Well appearing NECK:  No jugular venous distention, waveform within normal limits, carotid upstroke brisk and symmetric, no bruits, no thyromegaly LUNGS:  Clear to auscultation bilaterally HEART:  PMI not displaced or sustained,S1 and S2 within normal limits, no S3, no S4, no clicks, no rubs, no murmurs ABD:  Flat, positive bowel sounds normal in frequency in pitch, no bruits, no rebound, no guarding, no midline pulsatile mass, no hepatomegaly, no splenomegaly EXT:  2 plus pulses throughout, no edema, no cyanosis no clubbing  ASSESSMENT AND PLAN  CAD:  The patient has no new sypmtoms.  No further cardiovascular testing is indicated.  We will continue with aggressive risk reduction and meds as listed.  DYSLIPIDEMIA:  I suggested pravastatin.  She wants natural therapies but will investigate the statin data.

## 2012-10-20 ENCOUNTER — Other Ambulatory Visit: Payer: Self-pay

## 2013-01-20 ENCOUNTER — Other Ambulatory Visit: Payer: Self-pay

## 2013-06-24 ENCOUNTER — Other Ambulatory Visit: Payer: Self-pay | Admitting: Cardiology

## 2013-09-05 ENCOUNTER — Encounter: Payer: Medicare Other | Admitting: Cardiology

## 2013-09-05 NOTE — Progress Notes (Signed)
ERROR

## 2013-09-13 ENCOUNTER — Ambulatory Visit (INDEPENDENT_AMBULATORY_CARE_PROVIDER_SITE_OTHER): Payer: Medicare Other | Admitting: Cardiology

## 2013-09-13 ENCOUNTER — Ambulatory Visit: Payer: Medicare Other | Admitting: Cardiology

## 2013-09-13 ENCOUNTER — Encounter: Payer: Self-pay | Admitting: Cardiology

## 2013-09-13 VITALS — BP 108/68 | HR 74 | Ht 67.0 in | Wt 132.0 lb

## 2013-09-13 DIAGNOSIS — I2589 Other forms of chronic ischemic heart disease: Secondary | ICD-10-CM

## 2013-09-13 DIAGNOSIS — I2510 Atherosclerotic heart disease of native coronary artery without angina pectoris: Secondary | ICD-10-CM

## 2013-09-13 MED ORDER — NITROGLYCERIN 0.4 MG SL SUBL: 0.4000 mg | SUBLINGUAL_TABLET | SUBLINGUAL | Status: DC | PRN

## 2013-09-13 NOTE — Progress Notes (Signed)
Clinical Summary [name] is a 68 y.o.female last seen by Dr Antoine PocheHochrein, this is our first visit together. She is seen for the following medical problems.  1. CAD - hx of stenting to LAD in 2007 in setting of anterior MI. Echo at that time showed LVEF 50%.  - abnormal stress test in 2013, cath showed RCA with subtotal occlusion in mid portion, LVEF 40% by LV gram. RCA received a DES.  - denies any chest pain. No SOB or DOE. - compliant with meds  2. Hyperlipidemia - reports side effects on statins, caused some confusion she reports    Past Medical History  Diagnosis Date  . HLD (hyperlipidemia)     Mixed. Intolerant to statins.  . Ischemic cardiomyopathy     a. Previous EF 40% by cath, 50% by echo 09/2005. b. EF 40% by cath 09/2011.; EF 59% by nuclear study before cath (6/13)  . CAD (coronary artery disease)     a. anterior wall MI s/p LAD stent 2007. b. [place] s/p DES to RCA 09/2011 (following abnormal stress test).  . Major depressive disorder   . Complication of anesthesia 1980    "w/tubal, took me long time to wakeup; I've very sensitive to meds"  . Myocardial infarction 2007     Allergies  Allergen Reactions  . Prednisolone Acetate Other (See Comments)    Panic attacks     Current Outpatient Prescriptions  Medication Sig Dispense Refill  . aspirin 81 MG tablet Take 1 tablet (81 mg total) by mouth daily.      . carvedilol (COREG) 3.125 MG tablet Take 1 tablet (3.125 mg total) by mouth 2 (two) times daily with a meal.  60 tablet  6  . Flaxseed, Linseed, (FLAX SEED OIL PO) Take 1 each by mouth daily.      Marland Kitchen. lisinopril (PRINIVIL,ZESTRIL) 5 MG tablet TAKE 1/2 TABLET BY MOUTH DAILY  15 tablet  6  . nitroGLYCERIN (NITROSTAT) 0.4 MG SL tablet Place 1 tablet (0.4 mg total) under the tongue every 5 (five) minutes as needed. For chest pain.  25 tablet  3  . Omega-3 Fatty Acids (FISH OIL) 1000 MG CAPS Take 1 capsule by mouth daily.        . Red Yeast Rice 600 MG TABS Take 2  tablets by mouth 2 (two) times daily.        Marland Kitchen. venlafaxine (EFFEXOR) 75 MG tablet Take 75 mg by mouth daily.         No current facility-administered medications for this visit.     Past Surgical History  Procedure Laterality Date  . Coronary angioplasty with stent placement  ~ 2007; 09/23/11  . Tubal ligation  1980  . Augmentation mammaplasty  1992  . Cataract extraction w/ intraocular lens  implant, bilateral  ~ 2008     Allergies  Allergen Reactions  . Prednisolone Acetate Other (See Comments)    Panic attacks      Family History  Problem Relation Age of Onset  . Coronary artery disease      family hx  . Hyperlipidemia      family hx  . Hypertension      family hx  . Thyroid disease      family hx     Social History [name] reports that she quit smoking about 7 years ago. Her smoking use included Cigarettes. She has a 30 pack-year smoking history. She has never used smokeless tobacco. [name] reports  that she drinks alcohol.   Review of Systems CONSTITUTIONAL: No weight loss, fever, chills, weakness or fatigue.  HEENT: Eyes: No visual loss, blurred vision, double vision or yellow sclerae.No hearing loss, sneezing, congestion, runny nose or sore throat.  SKIN: No rash or itching.  CARDIOVASCULAR: per HPI RESPIRATORY: No shortness of breath, cough or sputum.  GASTROINTESTINAL: No anorexia, nausea, vomiting or diarrhea. No abdominal pain or blood.  GENITOURINARY: No burning on urination, no polyuria NEUROLOGICAL: No headache, dizziness, syncope, paralysis, ataxia, numbness or tingling in the extremities. No change in bowel or bladder control.  MUSCULOSKELETAL: No muscle, back pain, joint pain or stiffness.  LYMPHATICS: No enlarged nodes. No history of splenectomy.  PSYCHIATRIC: No history of depression or anxiety.  ENDOCRINOLOGIC: No reports of sweating, cold or heat intolerance. No polyuria or polydipsia.  Marland Kitchen.   Physical Examination p 74 bp 108/68 Wt 132  lbs BMI 21 Gen: resting comfortably, no acute distress HEENT: no scleral icterus, pupils equal round and reactive, no palptable cervical adenopathy,  CV: RRR, no m/r/g, no JVD, no carotid bruits Resp: Clear to auscultation bilaterally GI: abdomen is soft, non-tender, non-distended, normal bowel sounds, no hepatosplenomegaly MSK: extremities are warm, no edema.  Skin: warm, no rash Neuro:  no focal deficits Psych: appropriate affect   Diagnostic Studies Cath 09/2011  LV pressure: 174/30  Aortic pressure: 174/87  Angiography  Left Main: The left main is smooth and normal.  Left anterior Descending: there is a long stent in the proximal LAD. The stent is patent. There is a small amount of instent restenosis in the distal aspect of the stent - aproximately 30%. The 1st diag arises before the stent. The 2nd diag arises in the distal aspect of the stent and has a 40-50% stenosis at it's take-off.  Left Circumflex: The LCx is moderate in size and has minor luminal irregularities  Right Coronary Artery: The RCA is moderate in size and is dominant. There is a subtotal occlusion in the mid vessel. There is competetive flow from left to right collaterals  LV Gram: mild - moderate LV dysfunction. There is apical akinesis and inferior basal akinesis. The overall EF is 40%.  Complications: No apparent complications  Patient did tolerate procedure well.  Conclusions:  1. CAD primarily involving the mid RCA. Will transfer her to the main lab for PCI of her mid RCA.  The LAD stent is patent . There is a mild amount of ISR in the distal aspect of the stent.  2. Mild-moderate LV dysfunction with EF of 40%. Akinesis of the apex and inferior basal wall.  Lesion Data:  Vessel: RCA/mid  Percent stenosis (pre): 99  TIMI-flow (pre): 2  Stent: 3.0 x 15 mm drug-eluting  Percent stenosis (post): 0  TIMI-flow (post): 3  Conclusions: Successful PCI of the mid right coronary artery as above  Recommendations:  Dual antiplatelet therapy with aspirin and Plavix for a minimum of 12 months     Assessment and Plan  1. CAD - no current symptoms - continue risk factor modification and secondary prevention  2. Hyperlipidemia - reportedly intolerant to statins - no other evidence supported therapies at this time, continue lifestyle modification     Antoine PocheJonathan F. Branch, M.D., F.A.C.C.

## 2013-09-13 NOTE — Patient Instructions (Signed)
Continue all current medications. Your physician wants you to follow up in:  1 year.  You will receive a reminder letter in the mail one-two months in advance.  If you don't receive a letter, please call our office to schedule the follow up appointment   

## 2013-12-22 ENCOUNTER — Other Ambulatory Visit: Payer: Self-pay | Admitting: Cardiology

## 2014-02-13 ENCOUNTER — Other Ambulatory Visit: Payer: Self-pay | Admitting: Cardiology

## 2014-02-23 ENCOUNTER — Encounter (HOSPITAL_COMMUNITY): Payer: Self-pay | Admitting: Cardiovascular Disease

## 2014-09-11 ENCOUNTER — Other Ambulatory Visit: Payer: Self-pay

## 2014-11-06 ENCOUNTER — Ambulatory Visit (INDEPENDENT_AMBULATORY_CARE_PROVIDER_SITE_OTHER): Payer: Medicare Other | Admitting: Cardiology

## 2014-11-06 ENCOUNTER — Encounter: Payer: Self-pay | Admitting: Cardiology

## 2014-11-06 VITALS — BP 104/69 | HR 78 | Ht 67.0 in | Wt 123.0 lb

## 2014-11-06 DIAGNOSIS — I2510 Atherosclerotic heart disease of native coronary artery without angina pectoris: Secondary | ICD-10-CM

## 2014-11-06 DIAGNOSIS — E785 Hyperlipidemia, unspecified: Secondary | ICD-10-CM | POA: Diagnosis not present

## 2014-11-06 NOTE — Progress Notes (Signed)
Patient ID: Robin [name], female   DOB: [birth year], 69 y.o.   MRN: [id number]     Clinical Summary Robin [name] is a 69 y.o.female seen today for follow up of the following medical problems.   1. CAD - hx of stenting to LAD in 2007 in setting of anterior MI. Echo at that time showed LVEF 50%.  - abnormal stress test in 2013, cath showed RCA with subtotal occlusion in mid portion, LVEF 40% by LV gram. RCA received a DES.   - denies any chest pain. Denies any SOB or DOE. Does heavy yardwork without issues - compliant with meds    2. Hyperlipidemia - reports side effects on statins, caused some confusion she reports. She is unclear of which statins were tried. From Dr Felisa Bonier note 01/2009 pravastatin was stopped because the patient thought it was causing some short term memory issues, however after stopping there did not seem to be improvement. She has been off ever since.     Past Medical History  Diagnosis Date  . HLD (hyperlipidemia)     Mixed. Intolerant to statins.  . Ischemic cardiomyopathy     a. Previous EF 40% by cath, 50% by echo 09/2005. b. EF 40% by cath 09/2011.; EF 59% by nuclear study before cath (6/13)  . CAD (coronary artery disease)     a. anterior wall MI s/p LAD stent 2007. b. [place] s/p DES to RCA 09/2011 (following abnormal stress test).  . Major depressive disorder   . Complication of anesthesia 1980    "w/tubal, took me long time to wakeup; I've very sensitive to meds"  . Myocardial infarction 2007     Allergies  Allergen Reactions  . Prednisolone Acetate Other (See Comments)    Panic attacks     Current Outpatient Prescriptions  Medication Sig Dispense Refill  . alendronate (FOSAMAX) 70 MG tablet Take 1 tablet by mouth once a week.    Marland Kitchen aspirin 81 MG tablet Take 1 tablet (81 mg total) by mouth daily.    . carvedilol (COREG) 3.125 MG tablet TAKE 1 TABLET BY MOUTH TWICE DAILY WITH A MEAL. 60 tablet 6  . lisinopril (PRINIVIL,ZESTRIL) 5 MG tablet TAKE ONE  HALF TABLET EVERY DAY - EMERGENCY REFILL FAXED DR 15 tablet 11  . nitroGLYCERIN (NITROSTAT) 0.4 MG SL tablet Place 1 tablet (0.4 mg total) under the tongue every 5 (five) minutes as needed. For chest pain. 25 tablet 3  . venlafaxine (EFFEXOR) 75 MG tablet Take 75 mg by mouth daily.       No current facility-administered medications for this visit.     Past Surgical History  Procedure Laterality Date  . Coronary angioplasty with stent placement  ~ 2007; 09/23/11  . Tubal ligation  1980  . Augmentation mammaplasty  1992  . Cataract extraction w/ intraocular lens  implant, bilateral  ~ 2008  . Percutaneous coronary stent intervention (pci-s) N/A 09/23/2011    Procedure: PERCUTANEOUS CORONARY STENT INTERVENTION (PCI-S);  Surgeon: Tonny Bollman, MD;  Location: [facility] CATH LAB;  Service: Cardiovascular;  Laterality: N/A;     Allergies  Allergen Reactions  . Prednisolone Acetate Other (See Comments)    Panic attacks      Family History  Problem Relation Age of Onset  . Coronary artery disease      family hx  . Hyperlipidemia      family hx  . Hypertension      family hx  . Thyroid disease  family hx     Social History Robin [name] reports that she quit smoking about 9 years ago. Her smoking use included Cigarettes. She has a 30 pack-year smoking history. She has never used smokeless tobacco. Robin [name] reports that she drinks alcohol.   Review of Systems CONSTITUTIONAL: No weight loss, fever, chills, weakness or fatigue.  HEENT: Eyes: No visual loss, blurred vision, double vision or yellow sclerae.No hearing loss, sneezing, congestion, runny nose or sore throat.  SKIN: No rash or itching.  CARDIOVASCULAR: per hpi RESPIRATORY: No shortness of breath, cough or sputum.  GASTROINTESTINAL: No anorexia, nausea, vomiting or diarrhea. No abdominal pain or blood.  GENITOURINARY: No burning on urination, no polyuria NEUROLOGICAL: No headache, dizziness, syncope, paralysis, ataxia,  numbness or tingling in the extremities. No change in bowel or bladder control.  MUSCULOSKELETAL: No muscle, back pain, joint pain or stiffness.  LYMPHATICS: No enlarged nodes. No history of splenectomy.  PSYCHIATRIC: No history of depression or anxiety.  ENDOCRINOLOGIC: No reports of sweating, cold or heat intolerance. No polyuria or polydipsia.  Marland Kitchen   Physical Examination Filed Vitals:   11/06/14 1510  BP: 104/69  Pulse: 78   Filed Vitals:   11/06/14 1510  Height: 5\' 7"  (1.702 m)  Weight: 123 lb (55.792 kg)    Gen: resting comfortably, no acute distress HEENT: no scleral icterus, pupils equal round and reactive, no palptable cervical adenopathy,  CV: RRR, no m/r/g, no jvd Resp: Clear to auscultation bilaterally GI: abdomen is soft, non-tender, non-distended, normal bowel sounds, no hepatosplenomegaly MSK: extremities are warm, no edema.  Skin: warm, no rash Neuro:  no focal deficits Psych: appropriate affect   Diagnostic Studies Cath 09/2011  LV pressure: 174/30  Aortic pressure: 174/87  Angiography  Left Main: The left main is smooth and normal.  Left anterior Descending: there is a long stent in the proximal LAD. The stent is patent. There is a small amount of instent restenosis in the distal aspect of the stent - aproximately 30%. The 1st diag arises before the stent. The 2nd diag arises in the distal aspect of the stent and has a 40-50% stenosis at it's take-off.  Left Circumflex: The LCx is moderate in size and has minor luminal irregularities  Right Coronary Artery: The RCA is moderate in size and is dominant. There is a subtotal occlusion in the mid vessel. There is competetive flow from left to right collaterals  LV Gram: mild - moderate LV dysfunction. There is apical akinesis and inferior basal akinesis. The overall EF is 40%.  Complications: No apparent complications  Patient did tolerate procedure well.  Conclusions:  1. CAD primarily involving the  mid RCA. Will transfer her to the main lab for PCI of her mid RCA.  The LAD stent is patent . There is a mild amount of ISR in the distal aspect of the stent.  2. Mild-moderate LV dysfunction with EF of 40%. Akinesis of the apex and inferior basal wall.  Lesion Data:  Vessel: RCA/mid  Percent stenosis (pre): 99  TIMI-flow (pre): 2  Stent: 3.0 x 15 mm drug-eluting  Percent stenosis (post): 0  TIMI-flow (post): 3  Conclusions: Successful PCI of the mid right coronary artery as above  Recommendations: Dual antiplatelet therapy with aspirin and Plavix for a minimum of 12 months     Assessment and Plan  1. CAD - no current symptoms - continue risk factor modification and secondary prevention  2. Hyperlipidemia - reportedly intolerant to statins - will try  zetia 10mg  daily       Antoine Poche, M.D.

## 2014-11-06 NOTE — Patient Instructions (Signed)
Your physician wants you to follow-up in: 1 year with Dr. Branch  You will receive a reminder letter in the mail two months in advance. If you don't receive a letter, please call our office to schedule the follow-up appointment.  Your physician recommends that you continue on your current medications as directed. Please refer to the Current Medication list given to you today.  Thank you for choosing [facility]!!   

## 2014-11-09 NOTE — Addendum Note (Signed)
Addended by: Burman Nieves T on: 11/09/2014 03:00 PM   Modules accepted: Orders

## 2014-11-13 ENCOUNTER — Telehealth: Payer: Self-pay | Admitting: Cardiology

## 2014-11-13 MED ORDER — EZETIMIBE 10 MG PO TABS: 10.0000 mg | ORAL_TABLET | Freq: Every day | ORAL | Status: DC

## 2014-11-13 NOTE — Telephone Encounter (Signed)
Pt aware and agreeable to start Zetia 10 mg. Medication sent to pharmacy.

## 2014-11-13 NOTE — Telephone Encounter (Signed)
Patient states that she is returning call. No note in chart of who called her. / tg

## 2014-11-13 NOTE — Telephone Encounter (Signed)
-----   Message from Antoine Poche, MD sent at 11/08/2014  6:25 PM EDT ----- Please let patient know I reviewed her old records regarding her side effects to statins. I'd like to try her on a different nonstatin cholesterol medicine, can we start zetia  daily  Dominga Ferry MD

## 2015-02-12 ENCOUNTER — Other Ambulatory Visit: Payer: Self-pay | Admitting: Cardiology

## 2015-03-06 ENCOUNTER — Other Ambulatory Visit: Payer: Self-pay | Admitting: Cardiology

## 2015-03-06 NOTE — Telephone Encounter (Signed)
Refill complete 

## 2015-03-15 ENCOUNTER — Telehealth: Payer: Self-pay | Admitting: *Deleted

## 2015-03-15 MED ORDER — LISINOPRIL 5 MG PO TABS: ORAL_TABLET | ORAL | Status: DC

## 2015-03-15 MED ORDER — NITROGLYCERIN 0.4 MG SL SUBL: 0.4000 mg | SUBLINGUAL_TABLET | SUBLINGUAL | Status: DC | PRN

## 2015-03-15 MED ORDER — EZETIMIBE 10 MG PO TABS: 10.0000 mg | ORAL_TABLET | Freq: Every day | ORAL | Status: DC

## 2015-03-15 MED ORDER — CARVEDILOL 3.125 MG PO TABS: ORAL_TABLET | ORAL | Status: DC

## 2015-03-15 NOTE — Telephone Encounter (Signed)
Pt is going out of town for 3 months and wanted to [facility] pharmacies [facility] [place] 90 day supply sent to pharmacy as requested.

## 2015-04-30 DIAGNOSIS — J40 Bronchitis, not specified as acute or chronic: Secondary | ICD-10-CM | POA: Diagnosis not present

## 2015-04-30 DIAGNOSIS — R05 Cough: Secondary | ICD-10-CM | POA: Diagnosis not present

## 2015-04-30 DIAGNOSIS — R509 Fever, unspecified: Secondary | ICD-10-CM | POA: Diagnosis not present

## 2015-04-30 DIAGNOSIS — I10 Essential (primary) hypertension: Secondary | ICD-10-CM | POA: Diagnosis not present

## 2015-08-20 DIAGNOSIS — I10 Essential (primary) hypertension: Secondary | ICD-10-CM | POA: Diagnosis not present

## 2015-08-20 DIAGNOSIS — F329 Major depressive disorder, single episode, unspecified: Secondary | ICD-10-CM | POA: Diagnosis not present

## 2015-08-20 DIAGNOSIS — L409 Psoriasis, unspecified: Secondary | ICD-10-CM | POA: Diagnosis not present

## 2015-09-24 ENCOUNTER — Other Ambulatory Visit: Payer: Self-pay | Admitting: Cardiology

## 2015-10-15 DIAGNOSIS — Z87891 Personal history of nicotine dependence: Secondary | ICD-10-CM | POA: Diagnosis not present

## 2015-10-15 DIAGNOSIS — B029 Zoster without complications: Secondary | ICD-10-CM | POA: Diagnosis not present

## 2015-10-15 DIAGNOSIS — F329 Major depressive disorder, single episode, unspecified: Secondary | ICD-10-CM | POA: Diagnosis not present

## 2015-12-25 DIAGNOSIS — I491 Atrial premature depolarization: Secondary | ICD-10-CM | POA: Diagnosis not present

## 2015-12-25 DIAGNOSIS — R002 Palpitations: Secondary | ICD-10-CM | POA: Diagnosis not present

## 2015-12-25 DIAGNOSIS — I493 Ventricular premature depolarization: Secondary | ICD-10-CM | POA: Diagnosis not present

## 2016-02-29 DIAGNOSIS — Z299 Encounter for prophylactic measures, unspecified: Secondary | ICD-10-CM | POA: Diagnosis not present

## 2016-02-29 DIAGNOSIS — Z1389 Encounter for screening for other disorder: Secondary | ICD-10-CM | POA: Diagnosis not present

## 2016-02-29 DIAGNOSIS — F329 Major depressive disorder, single episode, unspecified: Secondary | ICD-10-CM | POA: Diagnosis not present

## 2016-03-03 ENCOUNTER — Ambulatory Visit: Payer: Medicare Other | Admitting: Cardiology

## 2016-03-03 NOTE — Progress Notes (Deleted)
Clinical Summary [name] is a 70 y.o.female  seen today for follow up of the following medical problems.   1. CAD - hx of stenting to LAD in 2007 in setting of anterior MI. Echo at that time showed LVEF 50%.  - abnormal stress test in 2013, cath showed RCA with subtotal occlusion in mid portion, LVEF 40% by LV gram. RCA received a DES.   - denies any chest pain. Denies any SOB or DOE. Does heavy yardwork without issues - compliant with meds    2. Hyperlipidemia - reports side effects on statins, caused some confusion she reports. She is unclear of which statins were tried. From Dr Felisa BoniereGents note 01/2009 pravastatin was stopped because the patient thought it was causing some short term memory issues, however after stopping there did not seem to be improvement. She has been off ever since.    Past Medical History:  Diagnosis Date  . CAD (coronary artery disease)    a. anterior wall MI s/p LAD stent 2007. b. [place] s/p DES to RCA 09/2011 (following abnormal stress test).  . Complication of anesthesia 1980   "w/tubal, took me long time to wakeup; I've very sensitive to meds"  . HLD (hyperlipidemia)    Mixed. Intolerant to statins.  . Ischemic cardiomyopathy    a. Previous EF 40% by cath, 50% by echo 09/2005. b. EF 40% by cath 09/2011.; EF 59% by nuclear study before cath (6/13)  . Major depressive disorder   . Myocardial infarction [facility](HCC) 2007     Allergies  Allergen Reactions  . Prednisolone Acetate Other (See Comments)    Panic attacks     Current Outpatient Prescriptions  Medication Sig Dispense Refill  . alendronate (FOSAMAX) 70 MG tablet Take 1 tablet by mouth once a week.    Marland Kitchen. aspirin 81 MG tablet Take 1 tablet (81 mg total) by mouth daily.    . carvedilol (COREG) 3.125 MG tablet TAKE 1 TABLET BY MOUTH TWICE DAILY WITH A MEAL. 180 tablet 1  . ezetimibe (ZETIA) 10 MG tablet Take 1 tablet (10 mg total) by mouth daily. 90 tablet 1  . lisinopril (PRINIVIL,ZESTRIL) 5  MG tablet TAKE ONE-HALF TABLET BY MOUTH ONCE DAILY 45 tablet 3  . nitroGLYCERIN (NITROSTAT) 0.4 MG SL tablet Place 1 tablet (0.4 mg total) under the tongue every 5 (five) minutes as needed. For chest pain. 25 tablet 3  . venlafaxine (EFFEXOR) 75 MG tablet Take 75 mg by mouth daily.       No current facility-administered medications for this visit.      Past Surgical History:  Procedure Laterality Date  . AUGMENTATION MAMMAPLASTY  1992  . CATARACT EXTRACTION W/ INTRAOCULAR LENS  IMPLANT, BILATERAL  ~ 2008  . CORONARY ANGIOPLASTY WITH STENT PLACEMENT  ~ 2007; 09/23/11  . PERCUTANEOUS CORONARY STENT INTERVENTION (PCI-S) N/A 09/23/2011   Procedure: PERCUTANEOUS CORONARY STENT INTERVENTION (PCI-S);  Surgeon: Tonny BollmanMichael Cooper, MD;  Location: [facility] CATH LAB;  Service: Cardiovascular;  Laterality: N/A;  . TUBAL LIGATION  1980     Allergies  Allergen Reactions  . Prednisolone Acetate Other (See Comments)    Panic attacks      Family History  Problem Relation Age of Onset  . Coronary artery disease      family hx  . Hyperlipidemia      family hx  . Hypertension      family hx  . Thyroid disease      family hx  Social History [name] reports that she quit smoking about 10 years ago. Her smoking use included Cigarettes. She has a 30.00 pack-year smoking history. She has never used smokeless tobacco. [name] reports that she drinks alcohol.   Review of Systems CONSTITUTIONAL: No weight loss, fever, chills, weakness or fatigue.  HEENT: Eyes: No visual loss, blurred vision, double vision or yellow sclerae.No hearing loss, sneezing, congestion, runny nose or sore throat.  SKIN: No rash or itching.  CARDIOVASCULAR:  RESPIRATORY: No shortness of breath, cough or sputum.  GASTROINTESTINAL: No anorexia, nausea, vomiting or diarrhea. No abdominal pain or blood.  GENITOURINARY: No burning on urination, no polyuria NEUROLOGICAL: No headache, dizziness, syncope, paralysis, ataxia, numbness  or tingling in the extremities. No change in bowel or bladder control.  MUSCULOSKELETAL: No muscle, back pain, joint pain or stiffness.  LYMPHATICS: No enlarged nodes. No history of splenectomy.  PSYCHIATRIC: No history of depression or anxiety.  ENDOCRINOLOGIC: No reports of sweating, cold or heat intolerance. No polyuria or polydipsia.  Marland Kitchen.   Physical Examination There were no vitals filed for this visit. There were no vitals filed for this visit.  Gen: resting comfortably, no acute distress HEENT: no scleral icterus, pupils equal round and reactive, no palptable cervical adenopathy,  CV Resp: Clear to auscultation bilaterally GI: abdomen is soft, non-tender, non-distended, normal bowel sounds, no hepatosplenomegaly MSK: extremities are warm, no edema.  Skin: warm, no rash Neuro:  no focal deficits Psych: appropriate affect   Diagnostic Studies Cath 09/2011  LV pressure: 174/30  Aortic pressure: 174/87  Angiography  Left Main: The left main is smooth and normal.  Left anterior Descending: there is a long stent in the proximal LAD. The stent is patent. There is a small amount of instent restenosis in the distal aspect of the stent - aproximately 30%. The 1st diag arises before the stent. The 2nd diag arises in the distal aspect of the stent and has a 40-50% stenosis at it's take-off.  Left Circumflex: The LCx is moderate in size and has minor luminal irregularities  Right Coronary Artery: The RCA is moderate in size and is dominant. There is a subtotal occlusion in the mid vessel. There is competetive flow from left to right collaterals  LV Gram: mild - moderate LV dysfunction. There is apical akinesis and inferior basal akinesis. The overall EF is 40%.  Complications: No apparent complications  Patient did tolerate procedure well.  Conclusions:  1. CAD primarily involving the mid RCA. Will transfer her to the main lab for PCI of her mid RCA.  The LAD stent is patent .  There is a mild amount of ISR in the distal aspect of the stent.  2. Mild-moderate LV dysfunction with EF of 40%. Akinesis of the apex and inferior basal wall.  Lesion Data:  Vessel: RCA/mid  Percent stenosis (pre): 99  TIMI-flow (pre): 2  Stent: 3.0 x 15 mm drug-eluting  Percent stenosis (post): 0  TIMI-flow (post): 3  Conclusions: Successful PCI of the mid right coronary artery as above  Recommendations: Dual antiplatelet therapy with aspirin and Plavix for a minimum of 12 months     Assessment and Plan  1. CAD - no current symptoms - continue risk factor modification and secondary prevention  2. Hyperlipidemia - reportedly intolerant to statins - will try zetia 10mg  daily      Antoine PocheJonathan F. Morgon Pamer, M.D.

## 2016-03-04 ENCOUNTER — Telehealth: Payer: Self-pay | Admitting: *Deleted

## 2016-03-04 MED ORDER — CARVEDILOL 3.125 MG PO TABS: ORAL_TABLET | ORAL | 0 refills | Status: DC

## 2016-03-04 MED ORDER — EZETIMIBE 10 MG PO TABS: 10.0000 mg | ORAL_TABLET | Freq: Every day | ORAL | 0 refills | Status: DC

## 2016-03-04 MED ORDER — LISINOPRIL 5 MG PO TABS: 2.5000 mg | ORAL_TABLET | Freq: Every day | ORAL | 0 refills | Status: DC

## 2016-03-04 NOTE — Telephone Encounter (Signed)
Medication sent to pharmacy - scheduled appt 05/15/16

## 2016-03-04 NOTE — Telephone Encounter (Signed)
Pt requesting 3 month supply on medications - she was no show for appt yesterday (says she thought it was today) will be out of town for 3 months - pt hasn't been seen in 1 year. Will forward to Dr. Wyline MoodBranch if ok to give pt 3 month supply

## 2016-03-04 NOTE — Telephone Encounter (Signed)
Ok to refill heart meds (lisionpril, coreg, zetia) for 3 months, schedule her appt for when she gets back.   Dominga FerryJ Luma Clopper MD

## 2016-03-24 ENCOUNTER — Ambulatory Visit: Payer: Medicare Other | Admitting: Cardiology

## 2016-04-22 DIAGNOSIS — Z299 Encounter for prophylactic measures, unspecified: Secondary | ICD-10-CM | POA: Diagnosis not present

## 2016-04-22 DIAGNOSIS — G309 Alzheimer's disease, unspecified: Secondary | ICD-10-CM | POA: Diagnosis not present

## 2016-04-22 DIAGNOSIS — Z713 Dietary counseling and surveillance: Secondary | ICD-10-CM | POA: Diagnosis not present

## 2016-04-22 DIAGNOSIS — F418 Other specified anxiety disorders: Secondary | ICD-10-CM | POA: Diagnosis not present

## 2016-04-22 DIAGNOSIS — F0280 Dementia in other diseases classified elsewhere without behavioral disturbance: Secondary | ICD-10-CM | POA: Diagnosis not present

## 2016-04-22 DIAGNOSIS — R413 Other amnesia: Secondary | ICD-10-CM | POA: Diagnosis not present

## 2016-04-22 DIAGNOSIS — Z681 Body mass index (BMI) 19 or less, adult: Secondary | ICD-10-CM | POA: Diagnosis not present

## 2016-05-01 DIAGNOSIS — F329 Major depressive disorder, single episode, unspecified: Secondary | ICD-10-CM | POA: Diagnosis not present

## 2016-05-01 DIAGNOSIS — Z299 Encounter for prophylactic measures, unspecified: Secondary | ICD-10-CM | POA: Diagnosis not present

## 2016-05-01 DIAGNOSIS — F418 Other specified anxiety disorders: Secondary | ICD-10-CM | POA: Diagnosis not present

## 2016-05-01 DIAGNOSIS — F0280 Dementia in other diseases classified elsewhere without behavioral disturbance: Secondary | ICD-10-CM | POA: Diagnosis not present

## 2016-05-01 DIAGNOSIS — G309 Alzheimer's disease, unspecified: Secondary | ICD-10-CM | POA: Diagnosis not present

## 2016-05-01 DIAGNOSIS — Z681 Body mass index (BMI) 19 or less, adult: Secondary | ICD-10-CM | POA: Diagnosis not present

## 2016-05-05 DIAGNOSIS — I6782 Cerebral ischemia: Secondary | ICD-10-CM | POA: Diagnosis not present

## 2016-05-05 DIAGNOSIS — F068 Other specified mental disorders due to known physiological condition: Secondary | ICD-10-CM | POA: Diagnosis not present

## 2016-05-05 DIAGNOSIS — R930 Abnormal findings on diagnostic imaging of skull and head, not elsewhere classified: Secondary | ICD-10-CM | POA: Diagnosis not present

## 2016-05-14 ENCOUNTER — Encounter: Payer: Self-pay | Admitting: *Deleted

## 2016-05-15 ENCOUNTER — Ambulatory Visit (INDEPENDENT_AMBULATORY_CARE_PROVIDER_SITE_OTHER): Payer: Medicare Other | Admitting: Cardiology

## 2016-05-15 ENCOUNTER — Encounter: Payer: Self-pay | Admitting: Cardiology

## 2016-05-15 VITALS — BP 135/83 | HR 83 | Ht 67.0 in | Wt 123.8 lb

## 2016-05-15 DIAGNOSIS — E782 Mixed hyperlipidemia: Secondary | ICD-10-CM

## 2016-05-15 DIAGNOSIS — I2510 Atherosclerotic heart disease of native coronary artery without angina pectoris: Secondary | ICD-10-CM

## 2016-05-15 MED ORDER — NITROGLYCERIN 0.4 MG SL SUBL: 0.4000 mg | SUBLINGUAL_TABLET | SUBLINGUAL | 3 refills | Status: AC | PRN

## 2016-05-15 NOTE — Patient Instructions (Signed)

## 2016-05-15 NOTE — Progress Notes (Signed)
Clinical Summary Ms. Attwood is a 71 y.o.female seen today for follow up of the following medical problems.   1. CAD - hx of stenting to LAD in 2007 in setting of anterior MI. Echo at that time showed LVEF 50%.  - abnormal stress test in 2013, cath showed RCA with subtotal occlusion in mid portion, LVEF 40% by LV gram. RCA received a DES.   - no recent chest pain. No SOB or DOE - compliant with meds.  2. Hyperlipidemia - reports side effects on statins, caused some confusion she reports. She is unclear of which statins were tried. From Dr Felisa Bonier note 01/2009 pravastatin was stopped because the patient thought it was causing some short term memory issues, however after stopping there did not seem to be improvement. She has been off ever since.   - last visit tried zetia 10mg  daily. Not taking regularly she reports.    3. Dementia - she reports recent diagnosis of Alzheimers  SH: her companion had recent stroke. Travels back and forth to [place].  Sister is Lauris Poag, also a patient of mine.  Past Medical History:  Diagnosis Date  . CAD (coronary artery disease)    a. anterior wall MI s/p LAD stent 2007. b. [place] s/p DES to RCA 09/2011 (following abnormal stress test).  . Complication of anesthesia 1980   "w/tubal, took me long time to wakeup; I've very sensitive to meds"  . HLD (hyperlipidemia)    Mixed. Intolerant to statins.  . Ischemic cardiomyopathy    a. Previous EF 40% by cath, 50% by echo 09/2005. b. EF 40% by cath 09/2011.; EF 59% by nuclear study before cath (6/13)  . Major depressive disorder   . Myocardial infarction 2007     Allergies  Allergen Reactions  . Prednisolone Acetate Other (See Comments)    Panic attacks     Current Outpatient Prescriptions  Medication Sig Dispense Refill  . alendronate (FOSAMAX) 70 MG tablet Take 1 tablet by mouth once a week.    Marland Kitchen aspirin 81 MG tablet Take 1 tablet (81 mg total) by mouth daily.    . carvedilol (COREG)  3.125 MG tablet TAKE 1 TABLET BY MOUTH TWICE DAILY WITH A MEAL. 180 tablet 0  . ezetimibe (ZETIA) 10 MG tablet Take 1 tablet (10 mg total) by mouth daily. 90 tablet 0  . lisinopril (PRINIVIL,ZESTRIL) 5 MG tablet Take 0.5 tablets (2.5 mg total) by mouth daily. 45 tablet 0  . nitroGLYCERIN (NITROSTAT) 0.4 MG SL tablet Place 1 tablet (0.4 mg total) under the tongue every 5 (five) minutes as needed. For chest pain. 25 tablet 3  . venlafaxine (EFFEXOR) 75 MG tablet Take 75 mg by mouth daily.       No current facility-administered medications for this visit.      Past Surgical History:  Procedure Laterality Date  . AUGMENTATION MAMMAPLASTY  1992  . CATARACT EXTRACTION W/ INTRAOCULAR LENS  IMPLANT, BILATERAL  ~ 2008  . CORONARY ANGIOPLASTY WITH STENT PLACEMENT  ~ 2007; 09/23/11  . PERCUTANEOUS CORONARY STENT INTERVENTION (PCI-S) N/A 09/23/2011   Procedure: PERCUTANEOUS CORONARY STENT INTERVENTION (PCI-S);  Surgeon: Tonny Bollman, MD;  Location: [facility] CATH LAB;  Service: Cardiovascular;  Laterality: N/A;  . TUBAL LIGATION  1980     Allergies  Allergen Reactions  . Prednisolone Acetate Other (See Comments)    Panic attacks      Family History  Problem Relation Age of Onset  . Coronary artery disease  family hx  . Hyperlipidemia      family hx  . Hypertension      family hx  . Thyroid disease      family hx     Social History Ms. Mcgaughy reports that she quit smoking about 10 years ago. Her smoking use included Cigarettes. She has a 30.00 pack-year smoking history. She has never used smokeless tobacco. Ms. Penland reports that she drinks alcohol.   Review of Systems CONSTITUTIONAL: No weight loss, fever, chills, weakness or fatigue.  HEENT: Eyes: No visual loss, blurred vision, double vision or yellow sclerae.No hearing loss, sneezing, congestion, runny nose or sore throat.  SKIN: No rash or itching.  CARDIOVASCULAR: per hpi RESPIRATORY: No shortness of breath, cough or sputum.    GASTROINTESTINAL: No anorexia, nausea, vomiting or diarrhea. No abdominal pain or blood.  GENITOURINARY: No burning on urination, no polyuria NEUROLOGICAL: No headache, dizziness, syncope, paralysis, ataxia, numbness or tingling in the extremities. No change in bowel or bladder control.  MUSCULOSKELETAL: No muscle, back pain, joint pain or stiffness.  LYMPHATICS: No enlarged nodes. No history of splenectomy.  PSYCHIATRIC: No history of depression or anxiety.  ENDOCRINOLOGIC: No reports of sweating, cold or heat intolerance. No polyuria or polydipsia.  Marland Kitchen   Physical Examination Vitals:   05/15/16 0907  BP: 135/83  Pulse: 83   Vitals:   05/15/16 0907  Weight: 123 lb 12.8 oz (56.2 kg)  Height: 5\' 7"  (1.702 m)     Gen: resting comfortably, no acute distress HEENT: no scleral icterus, pupils equal round and reactive, no palptable cervical adenopathy,  CV: RRR, no m/r/g, no jvd Resp: Clear to auscultation bilaterally GI: abdomen is soft, non-tender, non-distended, normal bowel sounds, no hepatosplenomegaly MSK: extremities are warm, no edema.  Skin: warm, no rash Neuro:  no focal deficits Psych: appropriate affect   Diagnostic Studies Cath 09/2011  LV pressure: 174/30  Aortic pressure: 174/87  Angiography  Left Main: The left main is smooth and normal.  Left anterior Descending: there is a long stent in the proximal LAD. The stent is patent. There is a small amount of instent restenosis in the distal aspect of the stent - aproximately 30%. The 1st diag arises before the stent. The 2nd diag arises in the distal aspect of the stent and has a 40-50% stenosis at it's take-off.  Left Circumflex: The LCx is moderate in size and has minor luminal irregularities  Right Coronary Artery: The RCA is moderate in size and is dominant. There is a subtotal occlusion in the mid vessel. There is competetive flow from left to right collaterals  LV Gram: mild - moderate LV dysfunction. There  is apical akinesis and inferior basal akinesis. The overall EF is 40%.  Complications: No apparent complications  Patient did tolerate procedure well.  Conclusions:  1. CAD primarily involving the mid RCA. Will transfer her to the main lab for PCI of her mid RCA.  The LAD stent is patent . There is a mild amount of ISR in the distal aspect of the stent.  2. Mild-moderate LV dysfunction with EF of 40%. Akinesis of the apex and inferior basal wall.  Lesion Data:  Vessel: RCA/mid  Percent stenosis (pre): 99  TIMI-flow (pre): 2  Stent: 3.0 x 15 mm drug-eluting  Percent stenosis (post): 0  TIMI-flow (post): 3  Conclusions: Successful PCI of the mid right coronary artery as above  Recommendations: Dual antiplatelet therapy with aspirin and Plavix for a minimum of 12 months  Assessment and Plan  1. CAD - no current symptoms. EKG in clinic  shows SR, no ischemic changes - continue curren tmeds  2. Hyperlipidemia - reportedly intolerant to statins - encouarged compliance with  zetia 10mg  daily      Antoine PocheJonathan F. Jayvien Rowlette, M.D

## 2016-05-26 ENCOUNTER — Ambulatory Visit: Payer: PPO | Admitting: Neurology

## 2016-06-10 ENCOUNTER — Ambulatory Visit (INDEPENDENT_AMBULATORY_CARE_PROVIDER_SITE_OTHER): Payer: PPO | Admitting: Neurology

## 2016-06-10 ENCOUNTER — Encounter: Payer: Self-pay | Admitting: Neurology

## 2016-06-10 VITALS — BP 109/76 | HR 67 | Ht 67.0 in | Wt 124.0 lb

## 2016-06-10 DIAGNOSIS — F0390 Unspecified dementia without behavioral disturbance: Secondary | ICD-10-CM

## 2016-06-10 DIAGNOSIS — I2510 Atherosclerotic heart disease of native coronary artery without angina pectoris: Secondary | ICD-10-CM | POA: Diagnosis not present

## 2016-06-10 DIAGNOSIS — F339 Major depressive disorder, recurrent, unspecified: Secondary | ICD-10-CM | POA: Diagnosis not present

## 2016-06-10 MED ORDER — DONEPEZIL HCL 10 MG PO TABS: 10.0000 mg | ORAL_TABLET | Freq: Every day | ORAL | 11 refills | Status: DC

## 2016-06-10 MED ORDER — MEMANTINE HCL 10 MG PO TABS: 10.0000 mg | ORAL_TABLET | Freq: Two times a day (BID) | ORAL | 11 refills | Status: DC

## 2016-06-10 NOTE — Progress Notes (Signed)
PATIENT: Robin [name] DOB: 08/11/1945  Chief Complaint  Patient presents with  . Memory Loss    MMSE 22/30 - 8 animals.  She is here with with her sisters, [name] and Lynden Ang, to have her worsening memory and word finding difficulty further evaluated.  Symptoms have been present for two years.   She has noticed improvement with donepezil 5mg , daily.  They would like to review her recent abnormal MRI Maryruth Bun - they have brought printed report).  . PCP    Ignatius Specking, MD     HISTORICAL  Robin [name] is a 71 years old right-handed female, accompanied by her 2 sisters, seen in refer by her primary care doctor Ignatius Specking for evaluation of memory loss, initial evaluation was on June 10 2016.  I reviewed and summarized the referring note, she had history of hypertension, coronary artery disease, status post stent, anxiety  She had 16 years of education, retired from [employer] job at age 50, she lives with her boyfriend, also reported lifelong history of depression, is taking Effexor 75 mg daily.  She was noted to have gradual onset memory loss since 2016, word finding difficulties, difficult to put sentences together, gradually getting worse, she still able to drive short distances, but on today's Mini-Mental status examination she was found to have difficulty copy simple designs.  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  Laboratory evaluation on April 22 2016, normal CBC hemoglobin of 13, normal B12 490, CMP with creatinine of 0.7, potassium was mild elevated 5.4, normal TSH.  We have personally reviewed MRI of the brain 2018 generalized atrophy, supratentorium small vessel disease. She was started on Aricept 5 mg once a day tolerating the medication wellsignificant side effect noticed.   REVIEW OF SYSTEMS: Full 14  system review of systems performed and notable only for headache, hearing loss, ringing ears  ALLERGIES: Allergies  Allergen Reactions  . Bee Venom Swelling  . Prednisolone Acetate Other (See Comments)    Panic attacks    HOME MEDICATIONS: Current Outpatient Prescriptions  Medication Sig Dispense Refill  . aspirin 81 MG tablet Take 1 tablet (81 mg total) by mouth daily.    . carvedilol (COREG) 3.125 MG tablet TAKE 1 TABLET BY MOUTH TWICE DAILY WITH A MEAL. 180 tablet 0  . donepezil (ARICEPT) 5 MG tablet 5 mg daily.    Marland Kitchen lisinopril (PRINIVIL,ZESTRIL) 5 MG tablet Take 0.5 tablets (2.5 mg total) by mouth daily. 45 tablet 0  . Multiple Vitamin (MULTIVITAMIN) tablet Take 1 tablet by mouth daily.    . nitroGLYCERIN (NITROSTAT) 0.4 MG SL tablet Place 1 tablet (0.4 mg total) under the tongue every 5 (five) minutes as needed. For chest pain. 25 tablet 3  . venlafaxine (EFFEXOR) 75 MG tablet Take 75 mg by mouth daily.       No current facility-administered medications for this visit.     PAST MEDICAL HISTORY: Past Medical History:  Diagnosis Date  . Anxiety   . CAD (coronary artery disease)    a. anterior wall MI s/p LAD stent 2007. b. [place] s/p DES to RCA 09/2011 (following abnormal stress test).  . Complication of anesthesia 1980   "w/tubal, took me long time to wakeup; I've very sensitive to meds"  . Heart disease   . HLD (hyperlipidemia)  Mixed. Intolerant to statins.  . Hyperlipemia   . Hypertension   . Ischemic cardiomyopathy    a. Previous EF 40% by cath, 50% by echo 09/2005. b. EF 40% by cath 09/2011.; EF 59% by nuclear study before cath (6/13)  . Major depressive disorder   . Memory loss   . Myocardial infarction 2007  . Panic attacks     PAST SURGICAL HISTORY: Past Surgical History:  Procedure Laterality Date  . AUGMENTATION MAMMAPLASTY  1992  . CATARACT EXTRACTION W/ INTRAOCULAR LENS  IMPLANT, BILATERAL  ~ 2008  . CORONARY ANGIOPLASTY WITH STENT PLACEMENT  ~ 2007;  09/23/11  . PERCUTANEOUS CORONARY STENT INTERVENTION (PCI-S) N/A 09/23/2011   Procedure: PERCUTANEOUS CORONARY STENT INTERVENTION (PCI-S);  Surgeon: Tonny Bollman, MD;  Location: [facility] CATH LAB;  Service: Cardiovascular;  Laterality: N/A;  . TUBAL LIGATION  1980    FAMILY HISTORY: Family History  Problem Relation Age of Onset  . Coronary artery disease      family hx  . Hyperlipidemia      family hx  . Hypertension      family hx  . Thyroid disease      family hx  . Heart disease Mother   . Hypertension Mother   . Atrial fibrillation Mother   . Heart disease Father     SOCIAL HISTORY:  Social History   Social History  . Marital status: Divorced    Spouse name: N/A  . Number of children: 2  . Years of education: Bachelors   Occupational History  . Retired    Social History Main Topics  . Smoking status: Former Smoker    Packs/day: 1.00    Years: 30.00    Types: Cigarettes    Quit date: 10/15/2005  . Smokeless tobacco: Never Used  . Alcohol use 0.0 oz/week     Comment: Occasional  . Drug use: No  . Sexual activity: Yes   Other Topics Concern  . Not on file   Social History Narrative   Lives at home significant other (female companion).   2-4 cups caffeine per day.   Right-handed.     PHYSICAL EXAM   Vitals:   06/10/16 1203  BP: 109/76  Pulse: 67  Weight: 124 lb (56.2 kg)  Height: 5\' 7"  (1.702 m)    Not recorded      Body mass index is 19.42 kg/m.  PHYSICAL EXAMNIATION:  Gen: NAD, conversant, well nourised, obese, well groomed                     Cardiovascular: Regular rate rhythm, no peripheral edema, warm, nontender. Eyes: Conjunctivae clear without exudates or hemorrhage Neck: Supple, no carotid bruits. Pulmonary: Clear to auscultation bilaterally   NEUROLOGICAL EXAM:  MENTAL STATUS: Speech:    Speech is normal; fluent and spontaneous with normal comprehension.  Cognition:Mini-Mental Status Examination 17/30, foraminal naming 5      Orientation: She is not oriented to date, year, month     Recent and remote memory: She missed 2 out of 3 recalls     Attention span and concentration: She has difficulty spelling world backwards.     Normal Language, naming, repeating,spontaneous speech: She has difficulty copy design     Fund of knowledge   CRANIAL NERVES: CN II: Visual fields are full to confrontation. Fundoscopic exam is normal with sharp discs and no vascular changes. Pupils are round equal and briskly reactive to light. CN III, IV, VI: extraocular movement are  normal. No ptosis. CN V: Facial sensation is intact to pinprick in all 3 divisions bilaterally. Corneal responses are intact.  CN VII: Face is symmetric with normal eye closure and smile. CN VIII: Hearing is normal to rubbing fingers CN IX, X: Palate elevates symmetrically. Phonation is normal. CN XI: Head turning and shoulder shrug are intact CN XII: Tongue is midline with normal movements and no atrophy.  MOTOR: There is no pronator drift of out-stretched arms. Muscle bulk and tone are normal. Muscle strength is normal.  REFLEXES: Reflexes are 2+ and symmetric at the biceps, triceps, knees, and ankles. Plantar responses are flexor.  SENSORY: Intact to light touch, pinprick, positional sensation and vibratory sensation are intact in fingers and toes.  COORDINATION: Rapid alternating movements and fine finger movements are intact. There is no dysmetria on finger-to-nose and heel-knee-shin.    GAIT/STANCE: Posture is normal. Gait is steady with normal steps, base, arm swing, and turning. Heel and toe walking are normal. Tandem gait is normal.  Romberg is absent.   DIAGNOSTIC DATA (LABS, IMAGING, TESTING) - I reviewed patient records, labs, notes, testing and imaging myself where available.   ASSESSMENT AND PLAN  Robin [name] is a 71 y.o. female   Dementia without behavioral issues  Most consistent with central nervous system degenerative  disorders  Increase Aricept to 10 mg daily, add on Namenda 10 mg twice a day  Continue moderate exercise    Robin [name], M.D. Ph.D.  [facility] [address], [address] [place], [place] [address] Ph: [phone] Fax: [phone]  CC: Ignatius Speckinghruv B Vyas, MD

## 2016-07-18 ENCOUNTER — Other Ambulatory Visit: Payer: Self-pay | Admitting: *Deleted

## 2016-07-18 MED ORDER — LISINOPRIL 5 MG PO TABS: 2.5000 mg | ORAL_TABLET | Freq: Every day | ORAL | 1 refills | Status: DC

## 2016-09-11 ENCOUNTER — Ambulatory Visit: Payer: PPO | Admitting: Neurology

## 2016-09-15 DIAGNOSIS — Z91030 Bee allergy status: Secondary | ICD-10-CM | POA: Diagnosis not present

## 2016-09-15 DIAGNOSIS — Z955 Presence of coronary angioplasty implant and graft: Secondary | ICD-10-CM | POA: Diagnosis not present

## 2016-09-15 DIAGNOSIS — E860 Dehydration: Secondary | ICD-10-CM | POA: Diagnosis not present

## 2016-09-15 DIAGNOSIS — Z7982 Long term (current) use of aspirin: Secondary | ICD-10-CM | POA: Diagnosis not present

## 2016-09-15 DIAGNOSIS — I10 Essential (primary) hypertension: Secondary | ICD-10-CM | POA: Diagnosis not present

## 2016-09-15 DIAGNOSIS — Z8249 Family history of ischemic heart disease and other diseases of the circulatory system: Secondary | ICD-10-CM | POA: Diagnosis not present

## 2016-09-15 DIAGNOSIS — I252 Old myocardial infarction: Secondary | ICD-10-CM | POA: Diagnosis not present

## 2016-09-15 DIAGNOSIS — I209 Angina pectoris, unspecified: Secondary | ICD-10-CM | POA: Diagnosis not present

## 2016-09-15 DIAGNOSIS — I2510 Atherosclerotic heart disease of native coronary artery without angina pectoris: Secondary | ICD-10-CM | POA: Diagnosis not present

## 2016-09-15 DIAGNOSIS — R0789 Other chest pain: Secondary | ICD-10-CM | POA: Diagnosis not present

## 2016-09-15 DIAGNOSIS — F0390 Unspecified dementia without behavioral disturbance: Secondary | ICD-10-CM | POA: Diagnosis not present

## 2016-09-15 DIAGNOSIS — Z79899 Other long term (current) drug therapy: Secondary | ICD-10-CM | POA: Diagnosis not present

## 2016-09-15 DIAGNOSIS — R079 Chest pain, unspecified: Secondary | ICD-10-CM | POA: Diagnosis not present

## 2016-09-15 DIAGNOSIS — R072 Precordial pain: Secondary | ICD-10-CM | POA: Diagnosis not present

## 2016-09-16 DIAGNOSIS — R079 Chest pain, unspecified: Secondary | ICD-10-CM | POA: Diagnosis not present

## 2016-09-16 DIAGNOSIS — I209 Angina pectoris, unspecified: Secondary | ICD-10-CM | POA: Diagnosis not present

## 2016-09-19 DIAGNOSIS — F418 Other specified anxiety disorders: Secondary | ICD-10-CM | POA: Diagnosis not present

## 2016-09-19 DIAGNOSIS — E7800 Pure hypercholesterolemia, unspecified: Secondary | ICD-10-CM | POA: Diagnosis not present

## 2016-09-19 DIAGNOSIS — I2510 Atherosclerotic heart disease of native coronary artery without angina pectoris: Secondary | ICD-10-CM | POA: Diagnosis not present

## 2016-09-19 DIAGNOSIS — E559 Vitamin D deficiency, unspecified: Secondary | ICD-10-CM | POA: Diagnosis not present

## 2016-09-19 DIAGNOSIS — I10 Essential (primary) hypertension: Secondary | ICD-10-CM | POA: Diagnosis not present

## 2016-09-19 DIAGNOSIS — F329 Major depressive disorder, single episode, unspecified: Secondary | ICD-10-CM | POA: Diagnosis not present

## 2016-09-19 DIAGNOSIS — F0280 Dementia in other diseases classified elsewhere without behavioral disturbance: Secondary | ICD-10-CM | POA: Diagnosis not present

## 2016-09-19 DIAGNOSIS — G309 Alzheimer's disease, unspecified: Secondary | ICD-10-CM | POA: Diagnosis not present

## 2016-09-19 DIAGNOSIS — Z299 Encounter for prophylactic measures, unspecified: Secondary | ICD-10-CM | POA: Diagnosis not present

## 2016-09-19 DIAGNOSIS — Z681 Body mass index (BMI) 19 or less, adult: Secondary | ICD-10-CM | POA: Diagnosis not present

## 2016-09-24 DIAGNOSIS — R5383 Other fatigue: Secondary | ICD-10-CM | POA: Diagnosis not present

## 2016-09-24 DIAGNOSIS — Z79899 Other long term (current) drug therapy: Secondary | ICD-10-CM | POA: Diagnosis not present

## 2016-09-24 DIAGNOSIS — Z0000 Encounter for general adult medical examination without abnormal findings: Secondary | ICD-10-CM | POA: Diagnosis not present

## 2016-09-24 DIAGNOSIS — I2510 Atherosclerotic heart disease of native coronary artery without angina pectoris: Secondary | ICD-10-CM | POA: Diagnosis not present

## 2016-09-24 DIAGNOSIS — F418 Other specified anxiety disorders: Secondary | ICD-10-CM | POA: Diagnosis not present

## 2016-09-24 DIAGNOSIS — E7800 Pure hypercholesterolemia, unspecified: Secondary | ICD-10-CM | POA: Diagnosis not present

## 2016-09-24 DIAGNOSIS — I10 Essential (primary) hypertension: Secondary | ICD-10-CM | POA: Diagnosis not present

## 2016-09-24 DIAGNOSIS — Z1211 Encounter for screening for malignant neoplasm of colon: Secondary | ICD-10-CM | POA: Diagnosis not present

## 2016-09-24 DIAGNOSIS — Z681 Body mass index (BMI) 19 or less, adult: Secondary | ICD-10-CM | POA: Diagnosis not present

## 2016-09-24 DIAGNOSIS — G309 Alzheimer's disease, unspecified: Secondary | ICD-10-CM | POA: Diagnosis not present

## 2016-09-24 DIAGNOSIS — Z1389 Encounter for screening for other disorder: Secondary | ICD-10-CM | POA: Diagnosis not present

## 2016-09-24 DIAGNOSIS — Z299 Encounter for prophylactic measures, unspecified: Secondary | ICD-10-CM | POA: Diagnosis not present

## 2016-09-24 DIAGNOSIS — Z7189 Other specified counseling: Secondary | ICD-10-CM | POA: Diagnosis not present

## 2016-10-15 ENCOUNTER — Telehealth: Payer: Self-pay

## 2016-10-15 MED ORDER — SIMVASTATIN 20 MG PO TABS: 20.0000 mg | ORAL_TABLET | Freq: Every day | ORAL | 3 refills | Status: DC

## 2016-10-15 NOTE — Telephone Encounter (Signed)
Ok to refill simva 20mg  daily  J Elaynah Virginia MD

## 2016-10-15 NOTE — Telephone Encounter (Signed)
[facility] [place] and was prescribed Simvastatin 20 mg and patient would like to know if Dr. Wyline MoodBranch will refill this. Per office note patient was intolerant to statins but patient states this one does not give her any problems at this time.

## 2016-10-15 NOTE — Telephone Encounter (Signed)
Patient notified. Sent to pharmacy in [place]

## 2016-10-29 ENCOUNTER — Ambulatory Visit (INDEPENDENT_AMBULATORY_CARE_PROVIDER_SITE_OTHER): Payer: PPO | Admitting: Neurology

## 2016-10-29 ENCOUNTER — Encounter: Payer: Self-pay | Admitting: Neurology

## 2016-10-29 VITALS — BP 130/76 | HR 86 | Ht 67.0 in | Wt 121.0 lb

## 2016-10-29 DIAGNOSIS — F339 Major depressive disorder, recurrent, unspecified: Secondary | ICD-10-CM

## 2016-10-29 DIAGNOSIS — F0390 Unspecified dementia without behavioral disturbance: Secondary | ICD-10-CM

## 2016-10-29 NOTE — Progress Notes (Signed)
PATIENT: Robin [name] DOB: 1945-11-18  Chief Complaint  Patient presents with  . Memory Loss    MMSE 24/30 - 5 animals.  She is here with her sister, [name].  She is only taking donepezil 5mg , daily because the 10mg , daily caused dizziness, blurred vision and "zombie-like state".  She has continued taking Namenda 10mg , BID.       HISTORICAL  VERGENE [name] is a 71 years old right-handed female, accompanied by her 2 sisters, seen in refer by her primary care doctor Ignatius Specking for evaluation of memory loss, initial evaluation was on June 10 2016.  I reviewed and summarized the referring note, she had history of hypertension, coronary artery disease, status post stent, anxiety  She had 16 years of education, retired from [employer] job at age 9, she lives with her boyfriend, also reported lifelong history of depression, is taking Effexor 75 mg daily.  She was noted to have gradual onset memory loss since 2016, word finding difficulties, difficult to put sentences together, gradually getting worse, she still able to drive short distances, but on today's Mini-Mental status examination she was found to have difficulty copy simple designs.  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  Laboratory evaluation on April 22 2016, normal CBC hemoglobin of 13, normal B12 490, CMP with creatinine of 0.7, potassium was mild elevated 5.4, normal TSH.  We have personally reviewed MRI of the brain 2018 generalized atrophy, supratentorium small vessel disease. She was started on Aricept 5 mg once a day tolerating the medication well, there was no significant side effect noticed.  UPDATE October 29 2016: She was admitted to [facility] on September 14 2016, complains of palpitation, no significant cardiology abnormality found.  She stopped effexor for no  clear reasons, she did not function well afterwards, lost weight, she was put back on effexor 75mg  daily, she feels much better, she is only taking aricept 5mg , namenda 5mg  bid,   MMSE is 24/30 today, difficulty with copy design, clock drawing, has right side visual neglect  REVIEW OF SYSTEMS: Full 14 system review of systems performed and notable only for headache, hearing loss, ringing ears  ALLERGIES: Allergies  Allergen Reactions  . Bee Venom Swelling  . Prednisolone Acetate Other (See Comments)    Panic attacks    HOME MEDICATIONS: Current Outpatient Prescriptions  Medication Sig Dispense Refill  . aspirin 81 MG tablet Take 1 tablet (81 mg total) by mouth daily.    . carvedilol (COREG) 3.125 MG tablet TAKE 1 TABLET BY MOUTH TWICE DAILY WITH A MEAL. 180 tablet 0  . donepezil (ARICEPT) 10 MG tablet Take 1 tablet (10 mg total) by mouth daily. 30 tablet 11  . lisinopril (PRINIVIL,ZESTRIL) 5 MG tablet Take 0.5 tablets (2.5 mg total) by mouth daily. 45 tablet 1  . memantine (NAMENDA) 10 MG tablet Take 1 tablet (10 mg total) by mouth 2 (two) times daily. 60 tablet 11  . Multiple Vitamin (MULTIVITAMIN) tablet Take 1 tablet by mouth daily.    . nitroGLYCERIN (NITROSTAT) 0.4 MG SL tablet Place 1 tablet (0.4 mg total) under the tongue every 5 (five) minutes as needed. For chest pain. 25 tablet 3  . simvastatin (ZOCOR) 20 MG tablet Take 1 tablet (20 mg total) by mouth at bedtime. 90 tablet 3  . venlafaxine (  EFFEXOR) 75 MG tablet Take 75 mg by mouth daily.       No current facility-administered medications for this visit.     PAST MEDICAL HISTORY: Past Medical History:  Diagnosis Date  . Anxiety   . CAD (coronary artery disease)    a. anterior wall MI s/p LAD stent 2007. b. [place] s/p DES to RCA 09/2011 (following abnormal stress test).  . Complication of anesthesia 1980   "w/tubal, took me long time to wakeup; I've very sensitive to meds"  . Heart disease   . HLD (hyperlipidemia)     Mixed. Intolerant to statins.  . Hyperlipemia   . Hypertension   . Ischemic cardiomyopathy    a. Previous EF 40% by cath, 50% by echo 09/2005. b. EF 40% by cath 09/2011.; EF 59% by nuclear study before cath (6/13)  . Major depressive disorder   . Memory loss   . Myocardial infarction (HCC) 2007  . Panic attacks     PAST SURGICAL HISTORY: Past Surgical History:  Procedure Laterality Date  . AUGMENTATION MAMMAPLASTY  1992  . CATARACT EXTRACTION W/ INTRAOCULAR LENS  IMPLANT, BILATERAL  ~ 2008  . CORONARY ANGIOPLASTY WITH STENT PLACEMENT  ~ 2007; 09/23/11  . PERCUTANEOUS CORONARY STENT INTERVENTION (PCI-S) N/A 09/23/2011   Procedure: PERCUTANEOUS CORONARY STENT INTERVENTION (PCI-S);  Surgeon: Tonny BollmanMichael Cooper, MD;  [facility];  Service: Cardiovascular;  Laterality: N/A;  . TUBAL LIGATION  1980    FAMILY HISTORY: Family History  Problem Relation Age of Onset  . Coronary artery disease Unknown        family hx  . Hyperlipidemia Unknown        family hx  . Hypertension Unknown        family hx  . Thyroid disease Unknown        family hx  . Heart disease Mother   . Hypertension Mother   . Atrial fibrillation Mother   . Heart disease Father     SOCIAL HISTORY:  Social History   Social History  . Marital status: Divorced    Spouse name: N/A  . Number of children: 2  . Years of education: Bachelors   Occupational History  . Retired    Social History Main Topics  . Smoking status: Former Smoker    Packs/day: 1.00    Years: 30.00    Types: Cigarettes    Quit date: 10/15/2005  . Smokeless tobacco: Never Used  . Alcohol use 0.0 oz/week     Comment: Occasional  . Drug use: No  . Sexual activity: Yes   Other Topics Concern  . Not on file   Social History Narrative   Lives at home significant other (female companion).   2-4 cups caffeine per day.   Right-handed.     PHYSICAL EXAM   Vitals:   10/29/16 1120  BP: 130/76  Pulse: 86  Weight: 121 lb (54.9 kg)    Height: 5\' 7"  (1.702 m)    Not recorded      Body mass index is 18.95 kg/m.  PHYSICAL EXAMNIATION:  Gen: NAD, conversant, well nourised, obese, well groomed                     Cardiovascular: Regular rate rhythm, no peripheral edema, warm, nontender. Eyes: Conjunctivae clear without exudates or hemorrhage Neck: Supple, no carotid bruits. Pulmonary: Clear to auscultation bilaterally   NEUROLOGICAL EXAM:  MENTAL STATUS:  MMSE - Mini Mental State Exam 10/29/2016 06/10/2016  Orientation to time 3 3  Orientation to Place 5 5  Registration 3 3  Attention/ Calculation 5 4  Recall 1 0  Language- name 2 objects 2 2  Language- repeat 1 1  Language- follow 3 step command 3 3  Language- read & follow direction 0 1  Write a sentence 1 0  Copy design 0 0  Total score 24 22  animal naming 6, could not draw clock.   CRANIAL NERVES: CN II: Visual fields are full to confrontation. Fundoscopic exam is normal with sharp discs and no vascular changes. Pupils are round equal and briskly reactive to light. CN III, IV, VI: extraocular movement are normal. No ptosis. CN Robin: Facial sensation is intact to pinprick in all 3 divisions bilaterally. Corneal responses are intact.  CN VII: Face is symmetric with normal eye closure and smile. CN VIII: Hearing is normal to rubbing fingers CN IX, X: Palate elevates symmetrically. Phonation is normal. CN XI: Head turning and shoulder shrug are intact CN XII: Tongue is midline with normal movements and no atrophy.  MOTOR: There is no pronator drift of out-stretched arms. Muscle bulk and tone are normal. Muscle strength is normal.  REFLEXES: Reflexes are 2+ and symmetric at the biceps, triceps, knees, and ankles. Plantar responses are flexor.  SENSORY: Intact to light touch, pinprick, positional sensation and vibratory sensation are intact in fingers and toes.  COORDINATION: Rapid alternating movements and fine finger movements are intact. There is  no dysmetria on finger-to-nose and heel-knee-shin.    GAIT/STANCE: Posture is normal. Gait is steady with normal steps, base, arm swing, and turning. Heel and toe walking are normal. Tandem gait is normal.  Romberg is absent.   DIAGNOSTIC DATA (LABS, IMAGING, TESTING) - I reviewed patient records, labs, notes, testing and imaging myself where available.   ASSESSMENT AND PLAN  MYLISSA LAMBE is a 71 y.o. female   Dementia    Depression  Keep effexor 75mg  daily  Aricept 5mg , Namenda 5mg  bid.    Levert Feinstein, M.D. Ph.D.  [facility] [address] [place], [place] [address] Ph: [phone] Fax: [phone]  CC: Ignatius Specking, MD

## 2016-11-04 DIAGNOSIS — E2839 Other primary ovarian failure: Secondary | ICD-10-CM | POA: Diagnosis not present

## 2017-01-01 DIAGNOSIS — H43811 Vitreous degeneration, right eye: Secondary | ICD-10-CM | POA: Diagnosis not present

## 2017-01-21 ENCOUNTER — Other Ambulatory Visit: Payer: Self-pay | Admitting: Cardiology

## 2017-01-21 MED ORDER — LISINOPRIL 5 MG PO TABS: 2.5000 mg | ORAL_TABLET | Freq: Every day | ORAL | 0 refills | Status: DC

## 2017-01-21 NOTE — Telephone Encounter (Signed)
Appointment scheduled for patient. Medication filled

## 2017-01-21 NOTE — Telephone Encounter (Signed)
*  STAT* If patient is at the pharmacy, call can be transferred to refill team.   1. Which medications need to be refilled? (please list name of each medication and dose if known)  lisinopril (PRINIVIL,ZESTRIL) 5 MG tablet    2. Which pharmacy/location (including street and city if local pharmacy) is medication to be sent to? [facility] - [place], [place]  3. Do they need a 30 day or 90 day supply? Requesting 90 supply

## 2017-01-26 ENCOUNTER — Other Ambulatory Visit: Payer: Self-pay | Admitting: Cardiology

## 2017-01-26 ENCOUNTER — Other Ambulatory Visit: Payer: Self-pay | Admitting: *Deleted

## 2017-01-26 MED ORDER — CARVEDILOL 3.125 MG PO TABS: ORAL_TABLET | ORAL | 1 refills | Status: DC

## 2017-01-26 MED ORDER — LISINOPRIL 5 MG PO TABS: 2.5000 mg | ORAL_TABLET | Freq: Every day | ORAL | 1 refills | Status: DC

## 2017-01-26 NOTE — Telephone Encounter (Signed)
°*  STAT* If patient is at the pharmacy, call can be transferred to refill team.   1. Which medications need to be refilled? (please list name of each medication and dose if known)  carvedilol (COREG) 3.125 MG tablet   lisinopril (PRINIVIL,ZESTRIL) 5 MG tablet      2. Which pharmacy/location (including street and city if local pharmacy) is medication to be sent to[place] [facility] [place]  3. Do they need a 30 day or 90 day supply? 90 day

## 2017-01-26 NOTE — Telephone Encounter (Signed)
Medication sent to pharmacy  

## 2017-03-05 ENCOUNTER — Other Ambulatory Visit: Payer: Self-pay | Admitting: *Deleted

## 2017-03-05 MED ORDER — DONEPEZIL HCL 10 MG PO TABS: 10.0000 mg | ORAL_TABLET | Freq: Every day | ORAL | 0 refills | Status: DC

## 2017-03-05 MED ORDER — MEMANTINE HCL 10 MG PO TABS: 10.0000 mg | ORAL_TABLET | Freq: Two times a day (BID) | ORAL | 0 refills | Status: DC

## 2017-03-09 ENCOUNTER — Other Ambulatory Visit: Payer: Self-pay | Admitting: *Deleted

## 2017-03-09 MED ORDER — SIMVASTATIN 20 MG PO TABS: 20.0000 mg | ORAL_TABLET | Freq: Every day | ORAL | 0 refills | Status: AC

## 2017-03-09 MED ORDER — CARVEDILOL 3.125 MG PO TABS: ORAL_TABLET | ORAL | 0 refills | Status: AC

## 2017-03-09 MED ORDER — LISINOPRIL 5 MG PO TABS: 2.5000 mg | ORAL_TABLET | Freq: Every day | ORAL | 0 refills | Status: AC

## 2017-03-11 ENCOUNTER — Other Ambulatory Visit: Payer: Self-pay | Admitting: *Deleted

## 2017-03-11 MED ORDER — MEMANTINE HCL 10 MG PO TABS: 10.0000 mg | ORAL_TABLET | Freq: Two times a day (BID) | ORAL | 1 refills | Status: DC

## 2017-03-11 MED ORDER — DONEPEZIL HCL 10 MG PO TABS: 10.0000 mg | ORAL_TABLET | Freq: Every day | ORAL | 1 refills | Status: DC

## 2017-04-07 ENCOUNTER — Encounter: Payer: Self-pay | Admitting: Nurse Practitioner

## 2017-04-23 ENCOUNTER — Ambulatory Visit: Payer: Self-pay | Admitting: Cardiology

## 2017-05-04 ENCOUNTER — Ambulatory Visit: Payer: PPO | Admitting: Nurse Practitioner

## 2017-09-28 ENCOUNTER — Other Ambulatory Visit: Payer: Self-pay | Admitting: Neurology

## 2017-12-02 ENCOUNTER — Other Ambulatory Visit: Payer: Self-pay | Admitting: Neurology

## 2018-02-23 ENCOUNTER — Other Ambulatory Visit: Payer: Self-pay | Admitting: Neurology

## 2018-02-27 ENCOUNTER — Other Ambulatory Visit: Payer: Self-pay | Admitting: Neurology
